# Patient Record
Sex: Female | Born: 1970
Health system: Southern US, Community
[De-identification: ages and names within clinical notes are randomized; demographics above are authoritative.]

## PROBLEM LIST (undated history)

## (undated) DIAGNOSIS — Z8742 Personal history of other diseases of the female genital tract: Secondary | ICD-10-CM

## (undated) DIAGNOSIS — F419 Anxiety disorder, unspecified: Secondary | ICD-10-CM

## (undated) DIAGNOSIS — N289 Disorder of kidney and ureter, unspecified: Secondary | ICD-10-CM

## (undated) HISTORY — DX: Personal history of other diseases of the female genital tract: Z87.42

## (undated) HISTORY — PX: OTHER SURGICAL HISTORY: SHX169

---
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
## 2010-08-26 ENCOUNTER — Inpatient Hospital Stay (HOSPITAL_COMMUNITY)
Admission: AD | Admit: 2010-08-26 | Discharge: 2010-08-26 | Payer: Self-pay | Source: Home / Self Care | Admitting: Obstetrics and Gynecology

## 2010-11-21 ENCOUNTER — Inpatient Hospital Stay (HOSPITAL_COMMUNITY)
Admission: AD | Admit: 2010-11-21 | Discharge: 2010-11-21 | Payer: Self-pay | Source: Home / Self Care | Attending: Obstetrics and Gynecology | Admitting: Obstetrics and Gynecology

## 2010-11-24 ENCOUNTER — Inpatient Hospital Stay (HOSPITAL_COMMUNITY)
Admission: RE | Admit: 2010-11-24 | Discharge: 2010-11-25 | Payer: Self-pay | Source: Home / Self Care | Attending: Obstetrics and Gynecology | Admitting: Obstetrics and Gynecology

## 2010-11-25 LAB — CBC
HCT: 29.2 % — ABNORMAL LOW (ref 36.0–46.0)
Hemoglobin: 10 g/dL — ABNORMAL LOW (ref 12.0–15.0)
MCH: 28.8 pg (ref 26.0–34.0)
MCHC: 34.2 g/dL (ref 30.0–36.0)
MCV: 84.1 fL (ref 78.0–100.0)

## 2011-01-13 LAB — RH IMMUNE GLOBULIN WORKUP (NOT WOMEN'S HOSP): Unit division: 0

## 2011-01-13 LAB — URINALYSIS, ROUTINE W REFLEX MICROSCOPIC
Glucose, UA: NEGATIVE mg/dL
Ketones, ur: NEGATIVE mg/dL
Leukocytes, UA: NEGATIVE
pH: 7 (ref 5.0–8.0)

## 2011-01-13 LAB — URINE MICROSCOPIC-ADD ON

## 2011-01-13 LAB — CBC
HCT: 33.6 % — ABNORMAL LOW (ref 36.0–46.0)
Hemoglobin: 11.8 g/dL — ABNORMAL LOW (ref 12.0–15.0)
MCH: 31.1 pg (ref 26.0–34.0)
MCV: 88.8 fL (ref 78.0–100.0)
Platelets: 241 10*3/uL (ref 150–400)

## 2011-01-13 LAB — FETAL SCREEN: Fetal Screen: NEGATIVE

## 2012-01-19 ENCOUNTER — Emergency Department (HOSPITAL_COMMUNITY)
Admission: EM | Admit: 2012-01-19 | Discharge: 2012-01-20 | Disposition: A | Discharge status: Home / Self Care | Payer: 59 | Attending: Emergency Medicine | Admitting: Emergency Medicine

## 2012-01-19 ENCOUNTER — Encounter (HOSPITAL_COMMUNITY): Payer: Self-pay

## 2012-01-19 DIAGNOSIS — T8140XA Infection following a procedure, unspecified, initial encounter: Secondary | ICD-10-CM | POA: Insufficient documentation

## 2012-01-19 DIAGNOSIS — T8149XA Infection following a procedure, other surgical site, initial encounter: Secondary | ICD-10-CM

## 2012-01-19 DIAGNOSIS — Y849 Medical procedure, unspecified as the cause of abnormal reaction of the patient, or of later complication, without mention of misadventure at the time of the procedure: Secondary | ICD-10-CM | POA: Insufficient documentation

## 2012-01-19 HISTORY — DX: Disorder of kidney and ureter, unspecified: N28.9

## 2012-01-19 MED ORDER — SODIUM CHLORIDE 0.9 % IV SOLN
3.0000 g | Freq: Once | INTRAVENOUS | Status: AC
  Administered 2012-01-20: 3 g via INTRAVENOUS
  Filled 2012-01-19: qty 3

## 2012-01-19 MED ORDER — HYDROMORPHONE HCL PF 1 MG/ML IJ SOLN
1.0000 mg | Freq: Once | INTRAMUSCULAR | Status: AC
  Administered 2012-01-20: 1 mg via INTRAVENOUS
  Filled 2012-01-19: qty 1

## 2012-01-19 MED ORDER — ONDANSETRON HCL 4 MG/2ML IJ SOLN
4.0000 mg | Freq: Once | INTRAMUSCULAR | Status: AC
  Administered 2012-01-20: 4 mg via INTRAVENOUS
  Filled 2012-01-19: qty 2
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
## 2012-01-19 NOTE — ED Notes (Signed)
Pt had left side root canal today. Swelling present to left side of face and under left eye. Pt unable to eat. Pt attempted to take pain medication but was unable to keep medication down. Dentist called pt to check on pt and informed swelling should not be as severe and to come to ER

## 2012-01-19 NOTE — ED Notes (Signed)
Patient states unable to keep down pain medication.

## 2012-01-20 MED ORDER — ONDANSETRON 8 MG PO TBDP: 8.0000 mg | ORAL_TABLET | Freq: Three times a day (TID) | ORAL | Status: AC | PRN

## 2012-01-20 MED ORDER — ONDANSETRON HCL 4 MG/2ML IJ SOLN
4.0000 mg | Freq: Once | INTRAMUSCULAR | Status: AC
  Administered 2012-01-20: 4 mg via INTRAVENOUS
  Filled 2012-01-20: qty 2

## 2012-05-20 ENCOUNTER — Emergency Department (HOSPITAL_COMMUNITY)
Admission: EM | Admit: 2012-05-20 | Discharge: 2012-05-20 | Disposition: A | Discharge status: Home / Self Care | Payer: PRIVATE HEALTH INSURANCE | Attending: Emergency Medicine | Admitting: Emergency Medicine

## 2012-05-20 ENCOUNTER — Encounter (HOSPITAL_COMMUNITY): Payer: Self-pay | Admitting: *Deleted

## 2012-05-20 DIAGNOSIS — H538 Other visual disturbances: Secondary | ICD-10-CM | POA: Insufficient documentation

## 2012-05-20 DIAGNOSIS — IMO0002 Reserved for concepts with insufficient information to code with codable children: Secondary | ICD-10-CM | POA: Insufficient documentation

## 2012-05-20 DIAGNOSIS — H5710 Ocular pain, unspecified eye: Secondary | ICD-10-CM | POA: Insufficient documentation

## 2012-05-20 DIAGNOSIS — Y9210 Unspecified residential institution as the place of occurrence of the external cause: Secondary | ICD-10-CM | POA: Insufficient documentation

## 2012-05-20 DIAGNOSIS — S0500XA Injury of conjunctiva and corneal abrasion without foreign body, unspecified eye, initial encounter: Secondary | ICD-10-CM

## 2012-05-20 DIAGNOSIS — S058X9A Other injuries of unspecified eye and orbit, initial encounter: Secondary | ICD-10-CM | POA: Insufficient documentation

## 2012-05-20 MED ORDER — TOBRAMYCIN 0.3 % OP OINT
TOPICAL_OINTMENT | Freq: Four times a day (QID) | OPHTHALMIC | Status: DC
  Administered 2012-05-20: 23:00:00 via OPHTHALMIC
  Filled 2012-05-20: qty 3.5

## 2012-05-20 MED ORDER — FLUORESCEIN SODIUM 1 MG OP STRP
ORAL_STRIP | OPHTHALMIC | Status: AC
  Administered 2012-05-20: 1 via OPHTHALMIC
  Filled 2012-05-20: qty 1

## 2012-05-20 MED ORDER — PROPARACAINE HCL 0.5 % OP SOLN
1.0000 [drp] | Freq: Once | OPHTHALMIC | Status: AC
  Administered 2012-05-20: 1 [drp] via OPHTHALMIC
  Filled 2012-05-20: qty 15
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
## 2012-11-26 ENCOUNTER — Encounter (HOSPITAL_COMMUNITY): Payer: Self-pay | Admitting: Emergency Medicine

## 2012-11-26 ENCOUNTER — Emergency Department (HOSPITAL_COMMUNITY)
Admission: EM | Admit: 2012-11-26 | Discharge: 2012-11-26 | Disposition: A | Discharge status: Home / Self Care | Payer: 59 | Attending: Emergency Medicine | Admitting: Emergency Medicine

## 2012-11-26 ENCOUNTER — Emergency Department (HOSPITAL_COMMUNITY): Payer: 59

## 2012-11-26 DIAGNOSIS — Z8679 Personal history of other diseases of the circulatory system: Secondary | ICD-10-CM | POA: Insufficient documentation

## 2012-11-26 DIAGNOSIS — M545 Low back pain, unspecified: Secondary | ICD-10-CM | POA: Insufficient documentation

## 2012-11-26 DIAGNOSIS — Z3202 Encounter for pregnancy test, result negative: Secondary | ICD-10-CM | POA: Insufficient documentation

## 2012-11-26 DIAGNOSIS — Z79899 Other long term (current) drug therapy: Secondary | ICD-10-CM | POA: Insufficient documentation

## 2012-11-26 DIAGNOSIS — IMO0002 Reserved for concepts with insufficient information to code with codable children: Secondary | ICD-10-CM | POA: Insufficient documentation

## 2012-11-26 DIAGNOSIS — Z87448 Personal history of other diseases of urinary system: Secondary | ICD-10-CM | POA: Insufficient documentation

## 2012-11-26 LAB — URINALYSIS, ROUTINE W REFLEX MICROSCOPIC
Bilirubin Urine: NEGATIVE
Glucose, UA: NEGATIVE mg/dL
Hgb urine dipstick: NEGATIVE
Ketones, ur: NEGATIVE mg/dL
Protein, ur: 30 mg/dL — AB
Urobilinogen, UA: 0.2 mg/dL (ref 0.0–1.0)

## 2012-11-26 LAB — URINE MICROSCOPIC-ADD ON

## 2012-11-26 MED ORDER — KETOROLAC TROMETHAMINE 30 MG/ML IJ SOLN
30.0000 mg | Freq: Once | INTRAMUSCULAR | Status: AC
  Administered 2012-11-26: 30 mg via INTRAVENOUS
  Filled 2012-11-26: qty 1

## 2012-11-26 MED ORDER — HYDROMORPHONE HCL PF 1 MG/ML IJ SOLN
1.0000 mg | Freq: Once | INTRAMUSCULAR | Status: DC
  Filled 2012-11-26: qty 1

## 2012-11-26 MED ORDER — OXYCODONE-ACETAMINOPHEN 5-325 MG PO TABS: 2.0000 | ORAL_TABLET | ORAL | Status: DC | PRN

## 2012-11-26 MED ORDER — PREDNISONE 20 MG PO TABS: 60.0000 mg | ORAL_TABLET | Freq: Every day | ORAL | Status: DC

## 2012-11-26 MED ORDER — PREDNISONE 20 MG PO TABS
60.0000 mg | ORAL_TABLET | Freq: Once | ORAL | Status: AC
  Administered 2012-11-26: 60 mg via ORAL
  Filled 2012-11-26: qty 3

## 2012-11-26 MED ORDER — DIAZEPAM 5 MG PO TABS
5.0000 mg | ORAL_TABLET | Freq: Once | ORAL | Status: AC
  Administered 2012-11-26: 5 mg via ORAL
  Filled 2012-11-26: qty 1

## 2012-11-26 MED ORDER — ONDANSETRON HCL 4 MG/2ML IJ SOLN
4.0000 mg | Freq: Once | INTRAMUSCULAR | Status: AC
  Administered 2012-11-26: 4 mg via INTRAVENOUS
  Filled 2012-11-26: qty 2

## 2012-11-26 MED ORDER — DIAZEPAM 5 MG PO TABS: 5.0000 mg | ORAL_TABLET | Freq: Two times a day (BID) | ORAL | Status: DC

## 2012-11-26 MED ORDER — HYDROMORPHONE HCL PF 1 MG/ML IJ SOLN
1.0000 mg | Freq: Once | INTRAMUSCULAR | Status: AC
  Administered 2012-11-26: 1 mg via INTRAVENOUS
  Filled 2012-11-26: qty 1

## 2012-11-26 NOTE — ED Notes (Signed)
Pt states that she began having heaviness in her legs after work on Monday morning.  The pain has gotten progressively worse over the week. Last night pt states she began having sacral pain in her back that is unrelieved by any medications or other interventions.

## 2012-11-26 NOTE — ED Notes (Signed)
MD at bedside. 

## 2012-11-26 NOTE — ED Notes (Signed)
Patient states that she has had lower back pain since Monday. States pain has gotten progressively worse. States that she has taken Valium, Baclofen, Percocet, Lortab, Tylenol, Motrin, Aspirin and has applied a Tens unit. All without the relief of pain. Patient states that she has had an episode of incontinence. Patient describes pain as a deep, dull ache from her waist down. Denies injury.
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
## 2012-11-27 LAB — URINE CULTURE: Colony Count: 7000

## 2013-10-11 LAB — HM COLONOSCOPY

## 2013-12-03 ENCOUNTER — Emergency Department (HOSPITAL_COMMUNITY)
Admission: EM | Admit: 2013-12-03 | Discharge: 2013-12-03 | Disposition: A | Discharge status: Home / Self Care | Payer: 59 | Attending: Emergency Medicine | Admitting: Emergency Medicine

## 2013-12-03 ENCOUNTER — Emergency Department (HOSPITAL_COMMUNITY): Payer: 59

## 2013-12-03 ENCOUNTER — Encounter (HOSPITAL_COMMUNITY): Payer: Self-pay | Admitting: Emergency Medicine

## 2013-12-03 DIAGNOSIS — S0990XA Unspecified injury of head, initial encounter: Secondary | ICD-10-CM | POA: Insufficient documentation

## 2013-12-03 DIAGNOSIS — Z87448 Personal history of other diseases of urinary system: Secondary | ICD-10-CM | POA: Insufficient documentation

## 2013-12-03 DIAGNOSIS — S139XXA Sprain of joints and ligaments of unspecified parts of neck, initial encounter: Secondary | ICD-10-CM | POA: Insufficient documentation

## 2013-12-03 DIAGNOSIS — S161XXA Strain of muscle, fascia and tendon at neck level, initial encounter: Secondary | ICD-10-CM

## 2013-12-03 DIAGNOSIS — Y92410 Unspecified street and highway as the place of occurrence of the external cause: Secondary | ICD-10-CM | POA: Insufficient documentation

## 2013-12-03 DIAGNOSIS — IMO0002 Reserved for concepts with insufficient information to code with codable children: Secondary | ICD-10-CM | POA: Insufficient documentation

## 2013-12-03 DIAGNOSIS — G43909 Migraine, unspecified, not intractable, without status migrainosus: Secondary | ICD-10-CM | POA: Insufficient documentation

## 2013-12-03 DIAGNOSIS — Z79899 Other long term (current) drug therapy: Secondary | ICD-10-CM | POA: Insufficient documentation

## 2013-12-03 DIAGNOSIS — Y9389 Activity, other specified: Secondary | ICD-10-CM | POA: Insufficient documentation

## 2013-12-03 MED ORDER — CYCLOBENZAPRINE HCL 10 MG PO TABS: 10.0000 mg | ORAL_TABLET | Freq: Three times a day (TID) | ORAL | Status: DC | PRN

## 2013-12-03 MED ORDER — OXYCODONE-ACETAMINOPHEN 5-325 MG PO TABS: 1.0000 | ORAL_TABLET | Freq: Four times a day (QID) | ORAL | Status: DC | PRN

## 2013-12-03 MED ORDER — OXYCODONE-ACETAMINOPHEN 5-325 MG PO TABS
1.0000 | ORAL_TABLET | Freq: Once | ORAL | Status: AC
  Administered 2013-12-03: 1 via ORAL
  Filled 2013-12-03: qty 1
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
## 2013-12-03 NOTE — Discharge Instructions (Signed)
Cervical Sprain A cervical sprain is an injury in the neck in which the strong, fibrous tissues (ligaments) that connect your neck bones stretch or tear. Cervical sprains can range from mild to severe. Severe cervical sprains can cause the neck vertebrae to be unstable. This can lead to damage of the spinal cord and can result in serious nervous system problems. The amount of time it takes for a cervical sprain to get better depends on the cause and extent of the injury. Most cervical sprains heal in 1 to 3 weeks. CAUSES  Severe cervical sprains may be caused by:   Contact sport injuries (such as from football, rugby, wrestling, hockey, auto racing, gymnastics, diving, martial arts, or boxing).   Motor vehicle collisions.   Whiplash injuries. This is an injury from a sudden forward-and backward whipping movement of the head and neck.  Falls.  Mild cervical sprains may be caused by:   Being in an awkward position, such as while cradling a telephone between your ear and shoulder.   Sitting in a chair that does not offer proper support.   Working at a poorly designed computer station.   Looking up or down for long periods of time.  SYMPTOMS   Pain, soreness, stiffness, or a burning sensation in the front, back, or sides of the neck. This discomfort may develop immediately after the injury or slowly, 24 hours or more after the injury.   Pain or tenderness directly in the middle of the back of the neck.   Shoulder or upper back pain.   Limited ability to move the neck.   Headache.   Dizziness.   Weakness, numbness, or tingling in the hands or arms.   Muscle spasms.   Difficulty swallowing or chewing.   Tenderness and swelling of the neck.  DIAGNOSIS  Most of the time your health care provider can diagnose a cervical sprain by taking your history and doing a physical exam. Your health care provider will ask about previous neck injuries and any known neck  problems, such as arthritis in the neck. X-rays may be taken to find out if there are any other problems, such as with the bones of the neck. Other tests, such as a CT scan or MRI, may also be needed.  TREATMENT  Treatment depends on the severity of the cervical sprain. Mild sprains can be treated with rest, keeping the neck in place (immobilization), and pain medicines. Severe cervical sprains are immediately immobilized. Further treatment is done to help with pain, muscle spasms, and other symptoms and may include:  Medicines, such as pain relievers, numbing medicines, or muscle relaxants.   Physical therapy. This may involve stretching exercises, strengthening exercises, and posture training. Exercises and improved posture can help stabilize the neck, strengthen muscles, and help stop symptoms from returning.  HOME CARE INSTRUCTIONS   Put ice on the injured area.   Put ice in a plastic bag.   Place a towel between your skin and the bag.   Leave the ice on for 15 20 minutes, 3 4 times a day.   If your injury was severe, you may have been given a cervical collar to wear. A cervical collar is a two-piece collar designed to keep your neck from moving while it heals.  Do not remove the collar unless instructed by your health care provider.  If you have long hair, keep it outside of the collar.  Ask your health care provider before making any adjustments to your collar.   Minor adjustments may be required over time to improve comfort and reduce pressure on your chin or on the back of your head.  Ifyou are allowed to remove the collar for cleaning or bathing, follow your health care provider's instructions on how to do so safely.  Keep your collar clean by wiping it with mild soap and water and drying it completely. If the collar you have been given includes removable pads, remove them every 1 2 days and hand wash them with soap and water. Allow them to air dry. They should be completely  dry before you wear them in the collar.  If you are allowed to remove the collar for cleaning and bathing, wash and dry the skin of your neck. Check your skin for irritation or sores. If you see any, tell your health care provider.  Do not drive while wearing the collar.   Only take over-the-counter or prescription medicines for pain, discomfort, or fever as directed by your health care provider.   Keep all follow-up appointments as directed by your health care provider.   Keep all physical therapy appointments as directed by your health care provider.   Make any needed adjustments to your workstation to promote good posture.   Avoid positions and activities that make your symptoms worse.   Warm up and stretch before being active to help prevent problems.  SEEK MEDICAL CARE IF:   Your pain is not controlled with medicine.   You are unable to decrease your pain medicine over time as planned.   Your activity level is not improving as expected.  SEEK IMMEDIATE MEDICAL CARE IF:   You develop any bleeding.  You develop stomach upset.  You have signs of an allergic reaction to your medicine.   Your symptoms get worse.   You develop new, unexplained symptoms.   You have numbness, tingling, weakness, or paralysis in any part of your body.  MAKE SURE YOU:   Understand these instructions.  Will watch your condition.  Will get help right away if you are not doing well or get worse. Document Released: 08/15/2007 Document Revised: 08/08/2013 Document Reviewed: 04/25/2013 [employer] Patient Information 2014 [employer].  
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
## 2013-12-03 NOTE — ED Notes (Signed)
Bed: [id number] Expected date:  Expected time:  Means of arrival:  Comments: EMS neck and head

## 2014-08-18 ENCOUNTER — Emergency Department (HOSPITAL_COMMUNITY)
Admission: EM | Admit: 2014-08-18 | Discharge: 2014-08-18 | Disposition: A | Discharge status: Home / Self Care | Payer: 59 | Attending: Emergency Medicine | Admitting: Emergency Medicine

## 2014-08-18 ENCOUNTER — Encounter (HOSPITAL_COMMUNITY): Payer: Self-pay | Admitting: Emergency Medicine

## 2014-08-18 DIAGNOSIS — G43909 Migraine, unspecified, not intractable, without status migrainosus: Secondary | ICD-10-CM | POA: Diagnosis not present

## 2014-08-18 DIAGNOSIS — Z7952 Long term (current) use of systemic steroids: Secondary | ICD-10-CM | POA: Insufficient documentation

## 2014-08-18 DIAGNOSIS — F419 Anxiety disorder, unspecified: Secondary | ICD-10-CM | POA: Insufficient documentation

## 2014-08-18 DIAGNOSIS — Z87448 Personal history of other diseases of urinary system: Secondary | ICD-10-CM | POA: Diagnosis not present

## 2014-08-18 DIAGNOSIS — M545 Low back pain, unspecified: Secondary | ICD-10-CM

## 2014-08-18 DIAGNOSIS — Z79899 Other long term (current) drug therapy: Secondary | ICD-10-CM | POA: Diagnosis not present

## 2014-08-18 DIAGNOSIS — Z791 Long term (current) use of non-steroidal anti-inflammatories (NSAID): Secondary | ICD-10-CM | POA: Insufficient documentation

## 2014-08-18 HISTORY — DX: Anxiety disorder, unspecified: F41.9

## 2014-08-18 MED ORDER — ONDANSETRON HCL 4 MG/2ML IJ SOLN
4.0000 mg | Freq: Once | INTRAMUSCULAR | Status: AC
  Administered 2014-08-18: 4 mg via INTRAVENOUS
  Filled 2014-08-18: qty 2

## 2014-08-18 MED ORDER — KETOROLAC TROMETHAMINE 30 MG/ML IJ SOLN
30.0000 mg | Freq: Once | INTRAMUSCULAR | Status: AC
  Administered 2014-08-18: 30 mg via INTRAVENOUS
  Filled 2014-08-18: qty 1

## 2014-08-18 MED ORDER — SODIUM CHLORIDE 0.9 % IV BOLUS (SEPSIS)
1000.0000 mL | Freq: Once | INTRAVENOUS | Status: AC
  Administered 2014-08-18: 1000 mL via INTRAVENOUS

## 2014-08-18 MED ORDER — DIAZEPAM 5 MG/ML IJ SOLN
5.0000 mg | Freq: Once | INTRAMUSCULAR | Status: AC
  Administered 2014-08-18: 5 mg via INTRAVENOUS
  Filled 2014-08-18: qty 2

## 2014-08-18 MED ORDER — KETOROLAC TROMETHAMINE 10 MG PO TABS: 10.0000 mg | ORAL_TABLET | Freq: Four times a day (QID) | ORAL | Status: DC | PRN

## 2014-08-18 MED ORDER — DIAZEPAM 10 MG PO TABS: 10.0000 mg | ORAL_TABLET | Freq: Four times a day (QID) | ORAL | Status: DC | PRN

## 2014-08-18 MED ORDER — METHYLPREDNISOLONE SODIUM SUCC 125 MG IJ SOLR
125.0000 mg | Freq: Once | INTRAMUSCULAR | Status: AC
  Administered 2014-08-18: 125 mg via INTRAVENOUS
  Filled 2014-08-18: qty 2

## 2014-08-18 MED ORDER — PREDNISONE 50 MG PO TABS: ORAL_TABLET | ORAL | Status: DC

## 2014-08-18 NOTE — ED Notes (Signed)
PT c/o back spasms worsening x1 day. PT c/o lower back and into left iliac crest. PT denies any leg pain or tingling.

## 2014-08-18 NOTE — Discharge Instructions (Signed)
Prescriptions for prednisone, Toradol tablets, Valium 10 mg.   Rest.

## 2014-08-18 NOTE — ED Notes (Signed)
Patient with no complaints at this time. Respirations even and unlabored. Skin warm/dry. Discharge instructions reviewed with patient at this time. Patient given opportunity to voice concerns/ask questions. IV removed per policy and band-aid applied to site. Patient discharged at this time and left Emergency Department with steady gait.  
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
## 2014-12-17 ENCOUNTER — Emergency Department (HOSPITAL_COMMUNITY): Payer: 59

## 2014-12-17 ENCOUNTER — Emergency Department (HOSPITAL_COMMUNITY)
Admission: EM | Admit: 2014-12-17 | Discharge: 2014-12-17 | Disposition: A | Discharge status: Home / Self Care | Payer: 59 | Attending: Emergency Medicine | Admitting: Emergency Medicine

## 2014-12-17 ENCOUNTER — Encounter (HOSPITAL_COMMUNITY): Payer: Self-pay | Admitting: Emergency Medicine

## 2014-12-17 DIAGNOSIS — Z87828 Personal history of other (healed) physical injury and trauma: Secondary | ICD-10-CM | POA: Diagnosis not present

## 2014-12-17 DIAGNOSIS — R112 Nausea with vomiting, unspecified: Secondary | ICD-10-CM | POA: Insufficient documentation

## 2014-12-17 DIAGNOSIS — R109 Unspecified abdominal pain: Secondary | ICD-10-CM | POA: Diagnosis not present

## 2014-12-17 DIAGNOSIS — Z87448 Personal history of other diseases of urinary system: Secondary | ICD-10-CM | POA: Diagnosis not present

## 2014-12-17 DIAGNOSIS — G43909 Migraine, unspecified, not intractable, without status migrainosus: Secondary | ICD-10-CM | POA: Diagnosis not present

## 2014-12-17 DIAGNOSIS — F419 Anxiety disorder, unspecified: Secondary | ICD-10-CM | POA: Diagnosis not present

## 2014-12-17 DIAGNOSIS — R1012 Left upper quadrant pain: Secondary | ICD-10-CM | POA: Diagnosis present

## 2014-12-17 DIAGNOSIS — Z79899 Other long term (current) drug therapy: Secondary | ICD-10-CM | POA: Insufficient documentation

## 2014-12-17 DIAGNOSIS — M549 Dorsalgia, unspecified: Secondary | ICD-10-CM

## 2014-12-17 DIAGNOSIS — Z3202 Encounter for pregnancy test, result negative: Secondary | ICD-10-CM | POA: Insufficient documentation

## 2014-12-17 LAB — CBC WITH DIFFERENTIAL/PLATELET
BASOS ABS: 0 10*3/uL (ref 0.0–0.1)
Basophils Relative: 0 % (ref 0–1)
EOS PCT: 4 % (ref 0–5)
Eosinophils Absolute: 0.3 10*3/uL (ref 0.0–0.7)
HEMATOCRIT: 40.4 % (ref 36.0–46.0)
HEMOGLOBIN: 13.4 g/dL (ref 12.0–15.0)
LYMPHS PCT: 12 % (ref 12–46)
Lymphs Abs: 0.9 10*3/uL (ref 0.7–4.0)
MCH: 29.8 pg (ref 26.0–34.0)
MCHC: 33.2 g/dL (ref 30.0–36.0)
MCV: 90 fL (ref 78.0–100.0)
MONO ABS: 0.4 10*3/uL (ref 0.1–1.0)
MONOS PCT: 5 % (ref 3–12)
NEUTROS ABS: 6.5 10*3/uL (ref 1.7–7.7)
Neutrophils Relative %: 79 % — ABNORMAL HIGH (ref 43–77)
Platelets: 226 10*3/uL (ref 150–400)
RBC: 4.49 MIL/uL (ref 3.87–5.11)
RDW: 13.2 % (ref 11.5–15.5)
WBC: 8.2 10*3/uL (ref 4.0–10.5)

## 2014-12-17 LAB — URINALYSIS, ROUTINE W REFLEX MICROSCOPIC
BILIRUBIN URINE: NEGATIVE
Glucose, UA: NEGATIVE mg/dL
HGB URINE DIPSTICK: NEGATIVE
KETONES UR: NEGATIVE mg/dL
Leukocytes, UA: NEGATIVE
Nitrite: NEGATIVE
PROTEIN: NEGATIVE mg/dL
SPECIFIC GRAVITY, URINE: 1.025 (ref 1.005–1.030)
UROBILINOGEN UA: 0.2 mg/dL (ref 0.0–1.0)
pH: 5.5 (ref 5.0–8.0)

## 2014-12-17 LAB — COMPREHENSIVE METABOLIC PANEL
ALK PHOS: 60 U/L (ref 39–117)
ALT: 25 U/L (ref 0–35)
AST: 21 U/L (ref 0–37)
Albumin: 4.3 g/dL (ref 3.5–5.2)
Anion gap: 8 (ref 5–15)
BUN: 15 mg/dL (ref 6–23)
CO2: 26 mmol/L (ref 19–32)
Calcium: 9.1 mg/dL (ref 8.4–10.5)
Chloride: 105 mmol/L (ref 96–112)
Creatinine, Ser: 0.96 mg/dL (ref 0.50–1.10)
GFR, EST AFRICAN AMERICAN: 83 mL/min — AB (ref >90)
GFR, EST NON AFRICAN AMERICAN: 72 mL/min — AB (ref >90)
GLUCOSE: 96 mg/dL (ref 70–99)
POTASSIUM: 4.1 mmol/L (ref 3.5–5.1)
SODIUM: 139 mmol/L (ref 135–145)
Total Bilirubin: 0.8 mg/dL (ref 0.3–1.2)
Total Protein: 7.1 g/dL (ref 6.0–8.3)

## 2014-12-17 LAB — LIPASE, BLOOD: LIPASE: 41 U/L (ref 11–59)

## 2014-12-17 LAB — PREGNANCY, URINE: PREG TEST UR: NEGATIVE

## 2014-12-17 MED ORDER — ONDANSETRON 4 MG PO TBDP: 4.0000 mg | ORAL_TABLET | Freq: Three times a day (TID) | ORAL | Status: DC | PRN

## 2014-12-17 MED ORDER — GI COCKTAIL ~~LOC~~
30.0000 mL | Freq: Once | ORAL | Status: AC
  Administered 2014-12-17: 30 mL via ORAL
  Filled 2014-12-17: qty 30

## 2014-12-17 MED ORDER — ONDANSETRON 8 MG PO TBDP
8.0000 mg | ORAL_TABLET | Freq: Once | ORAL | Status: AC
  Administered 2014-12-17: 8 mg via ORAL
  Filled 2014-12-17: qty 1

## 2014-12-17 MED ORDER — DICYCLOMINE HCL 10 MG/ML IM SOLN
20.0000 mg | Freq: Once | INTRAMUSCULAR | Status: AC
  Administered 2014-12-17: 20 mg via INTRAMUSCULAR
  Filled 2014-12-17: qty 2

## 2014-12-17 MED ORDER — DICYCLOMINE HCL 20 MG PO TABS: 20.0000 mg | ORAL_TABLET | Freq: Four times a day (QID) | ORAL | Status: DC | PRN
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
## 2014-12-17 NOTE — Discharge Instructions (Signed)
°Emergency Department Resource Guide °1) Find a Doctor and Pay Out of Pocket °Although you won't have to find out who is covered by your insurance plan, it is a good idea to ask around and get recommendations. You will then need to call the office and see if the doctor you have chosen will accept you as a new patient and what types of options they offer for patients who are self-pay. Some doctors offer discounts or will set up payment plans for their patients who do not have insurance, but you will need to ask so you aren't surprised when you get to your appointment. ° °2) Contact Your Local Health Department °Not all health departments have doctors that can see patients for sick visits, but many do, so it is worth a call to see if yours does. If you don't know where your local health department is, you can check in your phone book. The CDC also has a tool to help you locate your state's health department, and many state websites also have listings of all of their local health departments. ° °3) Find a Walk-in Clinic °If your illness is not likely to be very severe or complicated, you may want to try a walk in clinic. These are popping up all over the country in pharmacies, drugstores, and shopping centers. They're usually staffed by nurse practitioners or physician assistants that have been trained to treat common illnesses and complaints. They're usually fairly quick and inexpensive. However, if you have serious medical issues or chronic medical problems, these are probably not your best option. ° °No Primary Care Doctor: °- Call [facility] at  832-8000 - they can help you locate a primary care doctor that  accepts your insurance, provides certain services, etc. °- Physician Referral Service- 1-800-533-3463 ° °Chronic Pain Problems: °Organization         Address  Phone   Notes  °[facility]  ([phone]) [phone] Patients need to be referred by their primary care doctor.  ° °Medication  Assistance: °Organization         Address  Phone   Notes  °[place] [facility] [address] °[place], [place] [address] (336) 641-8030 --Must be a resident of [place] °-- Must have NO insurance coverage whatsoever (no Medicaid/ Medicare, etc.) °-- The pt. MUST have a primary care doctor that directs their care regularly and follows them in the community °  °[facility]  (866) 331-1348   °[employer]  (888) 892-1162   ° °Agencies that provide inexpensive medical care: °Organization         Address  Phone   Notes  °[facility]  (336) 832-8035   °[facility]    (336) 832-7272   °[facility] [address] °[place], [place] [address] (336) 832-4777   °[facility] [place] [facility] [address], °[place] (336) 271-4999   °[facility]    (336) 373-0678   °[facility]    (336) 272-1050   °[facility] ° [address], [place] Phone:  (336) 832-4444, Fax:  (336) 832-4440 Hours of Operation:  9 am - 6 pm, M-F.  Also accepts Medicaid/Medicare and self-pay.  °[facility] ° [address], [place] Phone: (336) [phone], Fax: (336) [phone]. Hours of Operation:  8:30 am - 5:30 pm, M-F.  Also accepts Medicaid and self-pay.  °[facility] [place] [address], [place] Phone: (336) 878-6027   °[facility] [address], [place], [place] (336)723-1848, Ext. 123 Mondays & Thursdays: 7-9 AM.  First 15 patients are seen on a first come, first serve basis. °  ° °Medicaid-accepting [place] Providers: ° °Organization         Address  Phone   Notes  °[facility] [address], [place] (336) [phone] Also accepts self-pay patients.  °[facility] [address], [place] ° (336) 856-9996   °[facility] [address], [place]  (336) 288-8857   °[facility] [address] [place] [address], [place] (336) 299-7000   °Veita Bland [address], [place]  ° (336) 373-1557 Only accepts [place] Access Medicaid patients after they have their name applied to their card.  ° °Self-Pay (no insurance) in [place]: ° °Organization         Address  Phone   Notes  °Sickle Cell Patients, [facility] [address], [place] (336) 832-1970   °[facility] [address], [place] (336) 832-4400   °[facility] ° [address], [place] (336) 992-4800   °[facility]/Dr. Osei-Bonsu ° [address] [place] [address], [place] or [address], [place] (336) [phone] Phone number for both [place] and [place] locations is the same.  °[facility] [address], [place] (336) 299-0000   °[facility] [address] [place] [address], [place] or [address] (336) 852-7530 °(336) 878-2260   °[facility] [address], [place] (336) 350-1642, phone; (336) 294-5005, fax Sees patients 1st and 3rd Saturday of every month.  Must not qualify for public or private insurance (i.e. Medicaid, Medicare, [place] Health Choice, Veterans' Benefits) • Household income should be no more than 200% of the poverty level •The clinic cannot treat you if you are pregnant or think you are pregnant • Sexually transmitted diseases are not treated at the clinic.  ° ° °Dental Care: °Organization         Address  Phone  Notes  °[place] [facility] [address], [place] (336) 641-6152 Accepts children up to age 21 who are enrolled in Medicaid or [place] Health Choice; pregnant women with a Medicaid card; and children who have applied for Medicaid or [place] Health Choice, but were declined, whose parents can pay a reduced fee at time of service.  °[place]  [facility] [place]  [address], [place] (336) 641-7733 Accepts children up to age 21 who are enrolled in Medicaid or [place] Health Choice; pregnant women with a Medicaid card; and children who have applied for Medicaid or [place] Health Choice, but were declined, whose parents can pay a reduced fee at time of service.  °[facility] ° [address], [place] (336) 641-4533 Patients are seen by appointment only. Walk-ins are not accepted. [facility] will see patients 18 years of age and older. °Monday - Tuesday (8am-5pm) °Most Wednesdays (8:30-5pm) °$30 per visit, cash only  °[facility] ° [address], [place] (336) 641-4533 Patients are seen by appointment only. Walk-ins are not accepted. [facility] will see patients 18 years of age and older. °One   Wednesday Evening (Monthly: Volunteer Based).  $30 per visit, cash only  °[facility]  (919) [phone] for adults; Children under age 4, call Graduate Pediatric Dentistry at ([phone]) [phone]. Children aged 4-14, please call ([phone]) [phone] to request a pediatric application. ° Dental services are provided in all areas of dental care including fillings, crowns and bridges, complete and partial dentures, implants, gum treatment, root canals, and extractions. Preventive care is also provided. Treatment is provided to both adults and children. °Patients are selected via a lottery and there is often a waiting list. °  °[facility] [address], °[place] ° (336) 763-8833 [email or web address] °  °[facility] [address], [place], [place] (336)723-1848, Ext. 123 Second and Fourth Thursday of each month, opens at 6:30 AM; Clinic ends at 9 AM.  Patients are seen on a first-come first-served basis, and a limited number are seen during each clinic.  ° °[facility] ° [address], [place], [place] (336) [phone]    Eligibility Requirements °You must have lived in [place], [place], or [place] counties for at least the last three months. °  You cannot be eligible for state or federal sponsored healthcare insurance, including Veterans Administration, Medicaid, or Medicare. °  You generally cannot be eligible for healthcare insurance through your employer.  °  How to apply: °Eligibility screenings are held every Tuesday and Wednesday afternoon from 1:00 pm until 4:00 pm. You do not need an appointment for the interview!  °[facility] [address], [place], [place] [phone]   °[facility]  336-342-8273   °[facility]  336-703-3100   °[facility]  336-570-6415   ° °Behavioral Health Resources in the Community: °Intensive Outpatient Programs °Organization         Address  Phone  Notes  °[facility] [address], [place], [place] 336-878-6098   °[facility] [address], [place], [place] 336-832-9800   °[facility] [address], [place], [place] ° 336-882-2125   °[facility] [address],  °[place], [place] 1-800-853-5163 or 336-641-4981   °Substance Abuse Resources °Organization         Address  Phone  Notes  °[facility]  336-882-2125   °[facility]  336-784-9470   °[facility]  336-285-9073   °[facility]  336-845-3988   °Residential & Outpatient Substance Abuse Program  1-800-659-3381   °Psychological Services °Organization         Address  Phone  Notes  °[facility]  336- 832-9600   °[facility]  336- 378-7881   °[facility] [address], [place] 1-800-853-5163 or 336-641-4981   ° °Mobile Crisis Teams °Organization         Address  Phone  Notes  °[facility], Mobile Crisis Care Unit  1-877-626-1772   °Assertive °[facility] ° [address]  [place], [place] 336-834-9664   °Sharon DeEsch [address] °[place] 336-554-5454   ° °Self-Help/Support Groups °Organization         Address  Phone             Notes  °[facility] of  - variety of support groups  336- 373-1402 Call for more information  °Narcotics Anonymous (NA), [facility] [address], °[place]  2 meetings at this location  ° °  Residential Treatment Programs Organization         Address  Phone  Notes  [facility] [address],    [place]  [phone]   [facility]  [address], [place] [phone], [place], [place] [phone]   [facility] [address], [place] [phone] Admissions: 8am-3pm M-F  [facility] [address]    [place], [place] [phone]   [facility] [address], [place], [place] [phone]   [facility] [address]  [place], [place] [phone]   [facility] Intensive Outpatient [address], [place], [place] [phone]   [facility] ([facility] [address]  [place], [place] [phone] or [phone]   [facility] [address] [place], [place] [phone] Accepts Medicaid  [facility] [address]  [place] [phone] Substance Abuse/Addiction Treatment   [facility] Organization         Address  Phone  Notes  [facility]  [phone](888) [phone]   Angie FavaJulie Brannon, PhD [address], [address] [place], [place]   ([phone]) [phone](336) [phone] or [phone](336) [phone]   [facility]   [address] [place], [place] [phone](336) ([phone]   [facility] [address], [place], [place] [phone](336) [phone] Insurance/Medicaid/sponsorship through [facility]  [facility] [address]                                    [place], [place] [phone]) [phone] Therapy/tele-psych/case    [facility] [address]   [place], [place] [phone](336) [phone]    Dr. Lolly MustacheArfeen  [phone]) [phone]   [facility] [place] [facility]  [employer] [facility] 1) [address], [place] 2) [address], [place] 3)  [address], [place] [phone](336) [phone](336) [phone]  [phone](336) [phone]   [facility] Child Abuse Hotline (351)385-2836(336) (828) 046-8897 or 201-336-1684(336) [phone] (After Hours)      Eat a bland diet, avoiding greasy, fatty, fried foods, as well as spicy and acidic foods or beverages.  Avoid eating within the hour or 2 before going to bed or laying down.  Also avoid teas, colas, coffee, chocolate, pepermint and spearment.  Take over the counter pepcid, one or two tablets by mouth twice a day, for the next 2 to 3 weeks.  May also take over the counter maalox/mylanta, as directed on packaging, as needed for discomfort.  Take the prescriptions as directed.  Call your regular medical doctor tomorrow to schedule a follow up appointment this week.  Return to the Emergency Department immediately if worsening.

## 2014-12-17 NOTE — ED Notes (Signed)
Pt states that she has been having luq pain since Friday intermittently and becoming constant this morning.  Also constant nausea with vomiting x1.

## 2015-11-19 DIAGNOSIS — Z01419 Encounter for gynecological examination (general) (routine) without abnormal findings: Secondary | ICD-10-CM | POA: Diagnosis not present

## 2015-11-19 DIAGNOSIS — Z6828 Body mass index (BMI) 28.0-28.9, adult: Secondary | ICD-10-CM | POA: Diagnosis not present

## 2015-11-25 MED FILL — BUPROPION HCL XL 300 MG TAB: 300 | 30 days supply | Qty: 30 | Fill #4

## 2015-11-25 MED FILL — ARMODAFINIL 150 MG TABLET: 150 | 30 days supply | Qty: 30 | Fill #0

## 2015-11-25 MED FILL — VIT D2 1.25 MG (50,000 UNIT: 1.25 MG | 28 days supply | Qty: 4 | Fill #4

## 2015-11-26 MED FILL — clonazePAM 0.5 MG TABS: 0.5 | 30 days supply | Qty: 60 | Fill #0

## 2015-12-18 ENCOUNTER — Telehealth: Payer: 59 | Admitting: Physician Assistant

## 2015-12-18 DIAGNOSIS — B9689 Other specified bacterial agents as the cause of diseases classified elsewhere: Secondary | ICD-10-CM

## 2015-12-18 DIAGNOSIS — J00 Acute nasopharyngitis [common cold]: Secondary | ICD-10-CM

## 2015-12-18 DIAGNOSIS — J208 Acute bronchitis due to other specified organisms: Principal | ICD-10-CM

## 2015-12-18 MED ORDER — LEVOFLOXACIN 500 MG PO TABS: 500.0000 mg | ORAL_TABLET | Freq: Every day | ORAL | Status: DC

## 2015-12-18 NOTE — Progress Notes (Signed)
We are sorry that you are not feeling well.  Here is how we plan to help!  Based on what you have shared with me it looks like you have upper respiratory tract inflammation that has resulted in a significant cough.  Inflammation and infection in the upper respiratory tract is commonly called bronchitis and has four common causes:  Allergies, Viral Infections, Acid Reflux and Bacterial Infections.  Allergies, viruses and acid reflux are treated by controlling symptoms or eliminating the cause. An example might be a cough caused by taking certain blood pressure medications. You stop the cough by changing the medication. Another example might be a cough caused by acid reflux. Controlling the reflux helps control the cough.  Based on your presentation I believe you most likely have A cough due to bacteria.  When patients have a fever and a productive cough with a change in color or increased sputum production, we are concerned about bacterial bronchitis.  If left untreated it can progress to pneumonia.  If your symptoms do not improve with your treatment plan it is important that you contact your provider.   I hve prescribed Levofloxacin 500 mg daily for 7 days   In addition you may use A non-prescription cough medication called Robitussin DAC. Take 2 teaspoons every 8 hours or Delsym: take 2 teaspoons every 12 hours.    HOME CARE . Only take medications as instructed by your medical team. . Complete the entire course of an antibiotic. . Drink plenty of fluids and get plenty of rest. . Avoid close contacts especially the very young and the elderly . Cover your mouth if you cough or cough into your sleeve. . Always remember to wash your hands . A steam or ultrasonic humidifier can help congestion.    GET HELP RIGHT AWAY IF: . You develop worsening fever. . You become short of breath . You cough up blood. . Your symptoms persist after you have completed your treatment plan MAKE SURE YOU    Understand these instructions.  Will watch your condition.  Will get help right away if you are not doing well or get worse.  Your e-visit answers were reviewed by a board certified advanced clinical practitioner to complete your personal care plan.  Depending on the condition, your plan could have included both over the counter or prescription medications. If there is a problem please reply  once you have received a response from your provider. Your safety is important to [place].  If you have drug allergies check your prescription carefully.    You can use MyChart to ask questions about today's visit, request a non-urgent call back, or ask for a work or school excuse for 24 hours related to this e-Visit. If it has been greater than 24 hours you will need to follow up with your provider, or enter a new e-Visit to address those concerns. You will get an e-mail in the next two days asking about your experience.  I hope that your e-visit has been valuable and will speed your recovery. Thank you for using e-visits.

## 2016-01-05 MED FILL — BUPROPION HCL XL 300 MG TAB: 300 | 30 days supply | Qty: 30 | Fill #5

## 2016-01-06 MED FILL — clonazePAM 0.5 MG TABS: 0.5 | 30 days supply | Qty: 60 | Fill #1

## 2016-01-06 MED FILL — ARMODAFINIL 150 MG TABLET: 150 | 30 days supply | Qty: 30 | Fill #1

## 2016-01-06 MED FILL — VIT D2 1.25 MG (50,000 UNIT: 1.25 MG | 28 days supply | Qty: 4 | Fill #5

## 2016-01-28 DIAGNOSIS — T148 Other injury of unspecified body region: Secondary | ICD-10-CM | POA: Diagnosis not present

## 2016-01-28 DIAGNOSIS — S6990XA Unspecified injury of unspecified wrist, hand and finger(s), initial encounter: Secondary | ICD-10-CM | POA: Diagnosis not present

## 2016-01-28 DIAGNOSIS — M79645 Pain in left finger(s): Secondary | ICD-10-CM | POA: Diagnosis not present

## 2016-02-19 MED FILL — BUPROPION HCL XL 300 MG TAB: 300 | 30 days supply | Qty: 30 | Fill #6

## 2016-02-19 MED FILL — VIT D2 1.25 MG (50,000 UNIT: 1.25 MG | 28 days supply | Qty: 4 | Fill #6

## 2016-02-19 MED FILL — clonazePAM 0.5 MG TABS: 0.5 | 30 days supply | Qty: 60 | Fill #2

## 2016-04-05 DIAGNOSIS — N390 Urinary tract infection, site not specified: Secondary | ICD-10-CM | POA: Diagnosis not present

## 2016-04-05 DIAGNOSIS — F419 Anxiety disorder, unspecified: Secondary | ICD-10-CM | POA: Diagnosis not present

## 2016-04-05 DIAGNOSIS — Z0000 Encounter for general adult medical examination without abnormal findings: Secondary | ICD-10-CM | POA: Diagnosis not present

## 2016-04-05 DIAGNOSIS — E559 Vitamin D deficiency, unspecified: Secondary | ICD-10-CM | POA: Diagnosis not present

## 2016-04-08 DIAGNOSIS — F419 Anxiety disorder, unspecified: Secondary | ICD-10-CM | POA: Diagnosis not present

## 2016-04-08 DIAGNOSIS — Z0000 Encounter for general adult medical examination without abnormal findings: Secondary | ICD-10-CM | POA: Diagnosis not present

## 2016-04-08 DIAGNOSIS — E559 Vitamin D deficiency, unspecified: Secondary | ICD-10-CM | POA: Diagnosis not present

## 2016-04-08 MED FILL — VIT D2 1.25 MG (50,000 UNIT: 1.25 MG | 28 days supply | Qty: 4 | Fill #0

## 2016-04-08 MED FILL — BUPROPION HCL XL 300 MG TAB: 300 | 30 days supply | Qty: 30 | Fill #0

## 2016-04-12 MED FILL — LORazepam 0.5 MG TABS: 0.5 | 30 days supply | Qty: 60 | Fill #0

## 2016-04-14 MED FILL — ARMODAFINIL 150 MG TABLET: 150 | 30 days supply | Qty: 30 | Fill #2

## 2016-05-13 MED FILL — LORazepam 0.5 MG TABS: 0.5 | 30 days supply | Qty: 60 | Fill #1

## 2016-05-14 MED FILL — BUPROPION HCL XL 300 MG TAB: 300 | 30 days supply | Qty: 30 | Fill #1

## 2016-05-14 MED FILL — ARMODAFINIL 150 MG TABLET: 150 | 30 days supply | Qty: 30 | Fill #3

## 2016-05-14 MED FILL — VIT D2 1.25 MG (50,000 UNIT: 1.25 MG | 28 days supply | Qty: 4 | Fill #1

## 2016-06-14 ENCOUNTER — Ambulatory Visit (INDEPENDENT_AMBULATORY_CARE_PROVIDER_SITE_OTHER): Payer: 59 | Admitting: Family Medicine

## 2016-06-14 ENCOUNTER — Encounter: Payer: Self-pay | Admitting: Family Medicine

## 2016-06-14 VITALS — BP 129/84 | HR 67 | Ht 69.0 in | Wt 170.0 lb

## 2016-06-14 DIAGNOSIS — M5442 Lumbago with sciatica, left side: Secondary | ICD-10-CM

## 2016-06-14 DIAGNOSIS — M545 Low back pain: Secondary | ICD-10-CM

## 2016-06-14 NOTE — Patient Instructions (Signed)
Your exam and history (along with what failed treatments you've tried) suggest lumbar radiculopathy (a pinched nerve in your low back) - specifically of L5 or S1. We will go ahead with an MRI to further assess. Stay as active as possible. I will call you the business day following the MRI to go over results and next steps.

## 2016-06-15 DIAGNOSIS — M545 Low back pain, unspecified: Secondary | ICD-10-CM | POA: Insufficient documentation

## 2016-06-15 NOTE — Assessment & Plan Note (Signed)
with pain in left ankle, hip, buttock as well.  Concerning for lumbar radiculopathy (S1, less likely L5).  She has already tried prednisone, nsaids, physical therapy, active release, chiropractic care without long lasting benefits.  Radiographs in past were normal.  Advised we go ahead with MRI lumbar spine to further assess.  Can take ibuprofen in meantime.
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
## 2016-06-19 ENCOUNTER — Ambulatory Visit (HOSPITAL_BASED_OUTPATIENT_CLINIC_OR_DEPARTMENT_OTHER)
Admission: RE | Admit: 2016-06-19 | Discharge: 2016-06-19 | Disposition: A | Discharge status: Home / Self Care | Payer: 59 | Source: Ambulatory Visit | Attending: Family Medicine | Admitting: Family Medicine

## 2016-06-19 DIAGNOSIS — M545 Low back pain: Secondary | ICD-10-CM

## 2016-06-19 DIAGNOSIS — M5137 Other intervertebral disc degeneration, lumbosacral region: Secondary | ICD-10-CM | POA: Diagnosis not present

## 2016-06-19 DIAGNOSIS — M5126 Other intervertebral disc displacement, lumbar region: Secondary | ICD-10-CM | POA: Diagnosis not present

## 2016-06-19 DIAGNOSIS — M5127 Other intervertebral disc displacement, lumbosacral region: Secondary | ICD-10-CM | POA: Insufficient documentation

## 2016-06-24 MED FILL — LORazepam 0.5 MG TABS: 0.5 | 30 days supply | Qty: 60 | Fill #2

## 2016-06-24 MED FILL — ARMODAFINIL 150 MG TABLET: 150 | 30 days supply | Qty: 30 | Fill #0

## 2016-06-24 MED FILL — VIT D2 1.25 MG (50,000 UNIT: 1.25 MG | 28 days supply | Qty: 4 | Fill #2 | Status: TO

## 2016-07-27 MED FILL — LORazepam 0.5 MG TABS: 0.5 | 30 days supply | Qty: 60 | Fill #3

## 2016-07-28 MED FILL — ARMODAFINIL 150 MG TABLET: 150 | 30 days supply | Qty: 30 | Fill #1 | Status: TO

## 2016-08-04 DIAGNOSIS — R509 Fever, unspecified: Secondary | ICD-10-CM | POA: Diagnosis not present

## 2016-08-04 DIAGNOSIS — J029 Acute pharyngitis, unspecified: Secondary | ICD-10-CM | POA: Diagnosis not present

## 2016-09-08 MED FILL — ARMODAFINIL 150 MG TABLET: 150 | 30 days supply | Qty: 30 | Fill #0

## 2016-09-09 MED FILL — LORazepam 0.5 MG TABS: 0.5 | 30 days supply | Qty: 60 | Fill #0

## 2016-10-07 MED FILL — VIT D2 1.25 MG (50,000 UNIT: 1.25 MG | 28 days supply | Qty: 4 | Fill #0

## 2016-10-07 MED FILL — LORazepam 0.5 MG TABS: 0.5 | 30 days supply | Qty: 60 | Fill #1

## 2016-10-07 MED FILL — ARMODAFINIL 150 MG TABLET: 150 | 30 days supply | Qty: 30 | Fill #1

## 2016-11-19 ENCOUNTER — Telehealth: Payer: Self-pay | Admitting: Family Medicine

## 2016-11-19 ENCOUNTER — Telehealth: Payer: Self-pay | Admitting: *Deleted

## 2016-11-19 DIAGNOSIS — M5442 Lumbago with sciatica, left side: Secondary | ICD-10-CM

## 2016-11-19 NOTE — Telephone Encounter (Signed)
[place]

## 2016-11-22 NOTE — Telephone Encounter (Signed)
Ok to go ahead with this. 

## 2016-11-22 NOTE — Telephone Encounter (Signed)
[place] faxed to [place] in [place].

## 2016-11-22 NOTE — Telephone Encounter (Signed)
Order faxed to PT in [place], [place]. 

## 2016-11-25 DIAGNOSIS — M25552 Pain in left hip: Secondary | ICD-10-CM | POA: Diagnosis not present

## 2016-11-25 DIAGNOSIS — M545 Low back pain: Secondary | ICD-10-CM | POA: Diagnosis not present

## 2016-12-01 DIAGNOSIS — M545 Low back pain: Secondary | ICD-10-CM | POA: Diagnosis not present

## 2016-12-01 DIAGNOSIS — M25552 Pain in left hip: Secondary | ICD-10-CM | POA: Diagnosis not present

## 2016-12-03 DIAGNOSIS — M545 Low back pain: Secondary | ICD-10-CM | POA: Diagnosis not present

## 2016-12-03 DIAGNOSIS — M25552 Pain in left hip: Secondary | ICD-10-CM | POA: Diagnosis not present

## 2016-12-03 MED FILL — ARMODAFINIL 150 MG TABLET: 150 | 30 days supply | Qty: 30 | Fill #2

## 2016-12-03 MED FILL — VIT D2 1.25 MG (50,000 UNIT: 1.25 MG | 28 days supply | Qty: 4 | Fill #1

## 2016-12-03 MED FILL — LORazepam 0.5 MG TABS: 0.5 | 30 days supply | Qty: 60 | Fill #2

## 2016-12-08 DIAGNOSIS — M25552 Pain in left hip: Secondary | ICD-10-CM | POA: Diagnosis not present

## 2016-12-08 DIAGNOSIS — M545 Low back pain: Secondary | ICD-10-CM | POA: Diagnosis not present

## 2016-12-14 DIAGNOSIS — M545 Low back pain: Secondary | ICD-10-CM | POA: Diagnosis not present

## 2016-12-14 DIAGNOSIS — M25552 Pain in left hip: Secondary | ICD-10-CM | POA: Diagnosis not present

## 2016-12-21 DIAGNOSIS — M545 Low back pain: Secondary | ICD-10-CM | POA: Diagnosis not present

## 2016-12-21 DIAGNOSIS — M25552 Pain in left hip: Secondary | ICD-10-CM | POA: Diagnosis not present

## 2016-12-24 DIAGNOSIS — M545 Low back pain: Secondary | ICD-10-CM | POA: Diagnosis not present

## 2016-12-24 DIAGNOSIS — M25552 Pain in left hip: Secondary | ICD-10-CM | POA: Diagnosis not present

## 2016-12-28 DIAGNOSIS — M25552 Pain in left hip: Secondary | ICD-10-CM | POA: Diagnosis not present

## 2016-12-28 DIAGNOSIS — M545 Low back pain: Secondary | ICD-10-CM | POA: Diagnosis not present

## 2016-12-31 DIAGNOSIS — M545 Low back pain: Secondary | ICD-10-CM | POA: Diagnosis not present

## 2016-12-31 DIAGNOSIS — M25552 Pain in left hip: Secondary | ICD-10-CM | POA: Diagnosis not present

## 2017-01-04 DIAGNOSIS — M25552 Pain in left hip: Secondary | ICD-10-CM | POA: Diagnosis not present

## 2017-01-04 DIAGNOSIS — M545 Low back pain: Secondary | ICD-10-CM | POA: Diagnosis not present

## 2017-01-07 DIAGNOSIS — M545 Low back pain: Secondary | ICD-10-CM | POA: Diagnosis not present

## 2017-01-07 DIAGNOSIS — M25552 Pain in left hip: Secondary | ICD-10-CM | POA: Diagnosis not present

## 2017-01-11 DIAGNOSIS — M545 Low back pain: Secondary | ICD-10-CM | POA: Diagnosis not present

## 2017-01-11 DIAGNOSIS — M25552 Pain in left hip: Secondary | ICD-10-CM | POA: Diagnosis not present

## 2017-01-14 DIAGNOSIS — M545 Low back pain: Secondary | ICD-10-CM | POA: Diagnosis not present

## 2017-01-14 DIAGNOSIS — M25552 Pain in left hip: Secondary | ICD-10-CM | POA: Diagnosis not present

## 2017-01-17 MED FILL — LORazepam 0.5 MG TABS: 0.5 | 30 days supply | Qty: 60 | Fill #3

## 2017-01-17 MED FILL — VIT D2 1.25 MG (50,000 UNIT: 1.25 MG | 28 days supply | Qty: 4 | Fill #2

## 2017-01-18 DIAGNOSIS — M25552 Pain in left hip: Secondary | ICD-10-CM | POA: Diagnosis not present

## 2017-01-18 DIAGNOSIS — M545 Low back pain: Secondary | ICD-10-CM | POA: Diagnosis not present

## 2017-01-20 DIAGNOSIS — M545 Low back pain: Secondary | ICD-10-CM | POA: Diagnosis not present

## 2017-01-20 DIAGNOSIS — M25552 Pain in left hip: Secondary | ICD-10-CM | POA: Diagnosis not present

## 2017-01-24 MED FILL — ARMODAFINIL 150 MG TABLET: 150 | 30 days supply | Qty: 30 | Fill #0

## 2017-01-25 DIAGNOSIS — M25552 Pain in left hip: Secondary | ICD-10-CM | POA: Diagnosis not present

## 2017-01-25 DIAGNOSIS — M545 Low back pain: Secondary | ICD-10-CM | POA: Diagnosis not present

## 2017-01-28 DIAGNOSIS — M25552 Pain in left hip: Secondary | ICD-10-CM | POA: Diagnosis not present

## 2017-01-28 DIAGNOSIS — M545 Low back pain: Secondary | ICD-10-CM | POA: Diagnosis not present

## 2017-02-01 DIAGNOSIS — M25552 Pain in left hip: Secondary | ICD-10-CM | POA: Diagnosis not present

## 2017-02-01 DIAGNOSIS — M545 Low back pain: Secondary | ICD-10-CM | POA: Diagnosis not present

## 2017-02-04 DIAGNOSIS — M25552 Pain in left hip: Secondary | ICD-10-CM | POA: Diagnosis not present

## 2017-02-10 DIAGNOSIS — M25552 Pain in left hip: Secondary | ICD-10-CM | POA: Diagnosis not present

## 2017-02-10 DIAGNOSIS — M545 Low back pain: Secondary | ICD-10-CM | POA: Diagnosis not present

## 2017-02-11 DIAGNOSIS — M545 Low back pain: Secondary | ICD-10-CM | POA: Diagnosis not present

## 2017-02-11 DIAGNOSIS — M25552 Pain in left hip: Secondary | ICD-10-CM | POA: Diagnosis not present

## 2017-02-15 DIAGNOSIS — M25552 Pain in left hip: Secondary | ICD-10-CM | POA: Diagnosis not present

## 2017-02-15 DIAGNOSIS — M545 Low back pain: Secondary | ICD-10-CM | POA: Diagnosis not present

## 2017-02-18 DIAGNOSIS — M545 Low back pain: Secondary | ICD-10-CM | POA: Diagnosis not present

## 2017-02-18 DIAGNOSIS — M25552 Pain in left hip: Secondary | ICD-10-CM | POA: Diagnosis not present

## 2017-02-25 DIAGNOSIS — M25552 Pain in left hip: Secondary | ICD-10-CM | POA: Diagnosis not present

## 2017-02-25 DIAGNOSIS — M545 Low back pain: Secondary | ICD-10-CM | POA: Diagnosis not present

## 2017-03-04 DIAGNOSIS — Z1231 Encounter for screening mammogram for malignant neoplasm of breast: Secondary | ICD-10-CM | POA: Diagnosis not present

## 2017-03-04 DIAGNOSIS — Z6827 Body mass index (BMI) 27.0-27.9, adult: Secondary | ICD-10-CM | POA: Diagnosis not present

## 2017-03-04 DIAGNOSIS — Z01419 Encounter for gynecological examination (general) (routine) without abnormal findings: Secondary | ICD-10-CM | POA: Diagnosis not present

## 2017-03-07 ENCOUNTER — Other Ambulatory Visit: Payer: Self-pay | Admitting: Internal Medicine

## 2017-03-07 MED FILL — VIT D2 1.25 MG (50,000 UNIT: 1.25 MG | 28 days supply | Qty: 4 | Fill #3

## 2017-03-09 DIAGNOSIS — Z01419 Encounter for gynecological examination (general) (routine) without abnormal findings: Secondary | ICD-10-CM | POA: Diagnosis not present

## 2017-03-09 DIAGNOSIS — M545 Low back pain: Secondary | ICD-10-CM | POA: Diagnosis not present

## 2017-03-09 DIAGNOSIS — M25552 Pain in left hip: Secondary | ICD-10-CM | POA: Diagnosis not present

## 2017-03-14 ENCOUNTER — Other Ambulatory Visit: Payer: Self-pay | Admitting: Internal Medicine

## 2017-03-17 DIAGNOSIS — M545 Low back pain: Secondary | ICD-10-CM | POA: Diagnosis not present

## 2017-03-17 DIAGNOSIS — M25552 Pain in left hip: Secondary | ICD-10-CM | POA: Diagnosis not present

## 2017-03-24 DIAGNOSIS — M545 Low back pain: Secondary | ICD-10-CM | POA: Diagnosis not present

## 2017-03-24 DIAGNOSIS — M25552 Pain in left hip: Secondary | ICD-10-CM | POA: Diagnosis not present

## 2017-04-11 DIAGNOSIS — Z0000 Encounter for general adult medical examination without abnormal findings: Secondary | ICD-10-CM | POA: Diagnosis not present

## 2017-04-11 DIAGNOSIS — E559 Vitamin D deficiency, unspecified: Secondary | ICD-10-CM | POA: Diagnosis not present

## 2017-04-18 DIAGNOSIS — Z0000 Encounter for general adult medical examination without abnormal findings: Secondary | ICD-10-CM | POA: Diagnosis not present

## 2017-04-22 MED FILL — LORazepam 0.5 MG TABS: 0.5 | 30 days supply | Qty: 60 | Fill #0

## 2017-04-22 MED FILL — ARMODAFINIL 150 MG TABLET: 150 | 30 days supply | Qty: 30 | Fill #0

## 2017-04-25 MED FILL — BUTALB-ACETAMIN-CAFF 50-300: 50-300-40 | 30 days supply | Qty: 30 | Fill #0

## 2017-04-25 MED FILL — VIT D2 1.25 MG (50,000 UNIT: 1.25 MG | 28 days supply | Qty: 4 | Fill #0

## 2017-06-15 MED FILL — LORazepam 0.5 MG TABS: 0.5 | 30 days supply | Qty: 60 | Fill #1

## 2017-06-15 MED FILL — VIT D2 1.25 MG (50,000 UNIT: 1.25 MG | 28 days supply | Qty: 4 | Fill #1

## 2017-06-15 MED FILL — ARMODAFINIL 150 MG TABLET: 150 | 30 days supply | Qty: 30 | Fill #1

## 2017-08-22 DIAGNOSIS — Z30433 Encounter for removal and reinsertion of intrauterine contraceptive device: Secondary | ICD-10-CM | POA: Diagnosis not present

## 2017-11-03 MED FILL — BACLOFEN 10 MG TABLET: 10 | 20 days supply | Qty: 60 | Fill #0

## 2017-11-03 MED FILL — VIT D2 1.25 MG (50,000 UNIT: 1.25 MG | 28 days supply | Qty: 4 | Fill #2

## 2018-01-17 MED FILL — ARMODAFINIL 150 MG TABLET: 150 | 30 days supply | Qty: 30 | Fill #0

## 2018-01-17 MED FILL — LORazepam 0.5 MG TABS: 0.5 | 30 days supply | Qty: 60 | Fill #0

## 2018-01-17 MED FILL — VIT D2 1.25 MG (50,000 UNIT: 1.25 MG | 28 days supply | Qty: 4 | Fill #3

## 2018-01-17 MED FILL — BUTALB-ACETAMIN-CAFF 50-300: 50-300-40 | 30 days supply | Qty: 30 | Fill #1

## 2018-03-15 ENCOUNTER — Telehealth: Payer: 59 | Admitting: Family

## 2018-03-15 DIAGNOSIS — R399 Unspecified symptoms and signs involving the genitourinary system: Secondary | ICD-10-CM | POA: Diagnosis not present

## 2018-03-15 MED ORDER — CEPHALEXIN 500 MG PO CAPS: 500.0000 mg | ORAL_CAPSULE | Freq: Two times a day (BID) | ORAL | 0 refills | Status: DC

## 2018-03-15 NOTE — Progress Notes (Signed)

## 2018-04-02 IMAGING — MR MR LUMBAR SPINE W/O CM
4 of 5 series · 28 of 48 positions shown · non-contrast
Comparison: None.

CLINICAL DATA: Low back pain for 5 years, increased 2 months ago.
LEFT-sided symptoms.

EXAM:
MRI LUMBAR SPINE WITHOUT CONTRAST
TECHNIQUE: Multiplanar, multisequence MR imaging of the lumbar spine was
performed. No intravenous contrast was administered.

[Series 2: T1 · sagittal · 4.0mm · 0.51mm/px · 6 of 14 slices shown (1 of 2)]
[im 1/14]
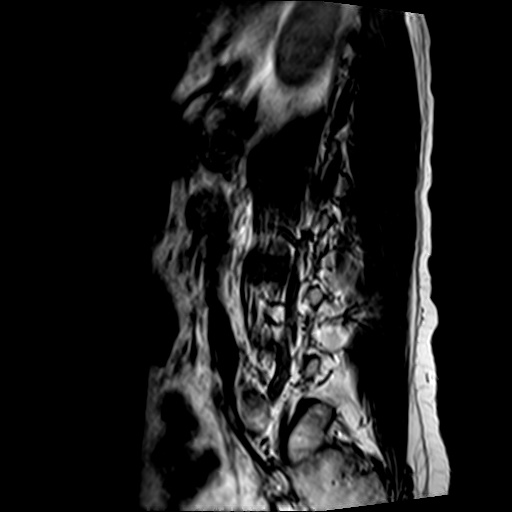
[im 3/14]
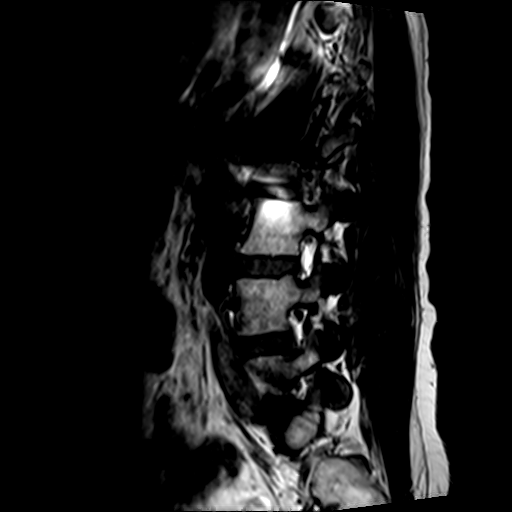
[im 6/14]
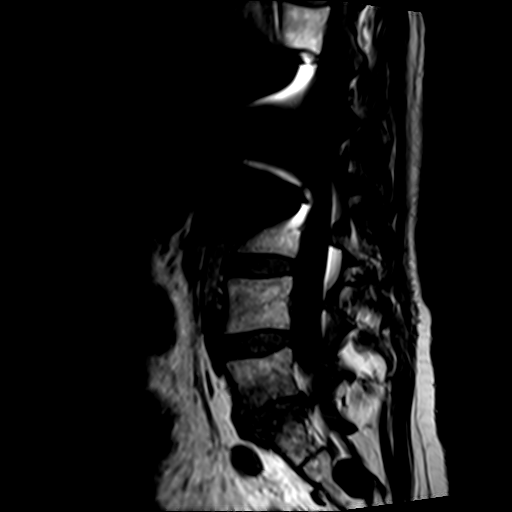
[im 8/14]
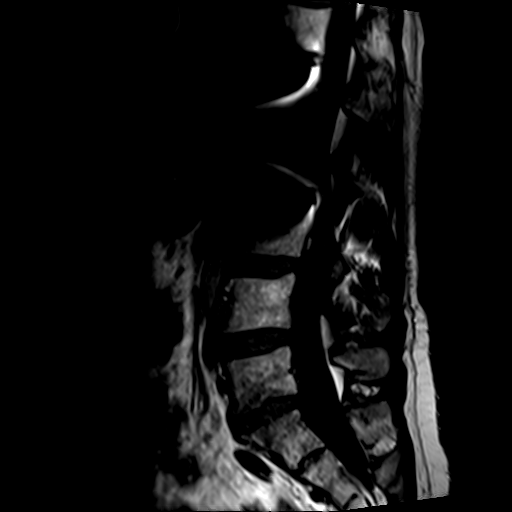
[im 11/14]
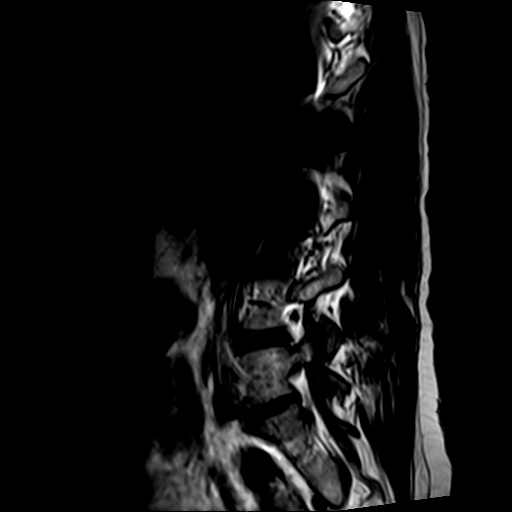
[im 14/14]
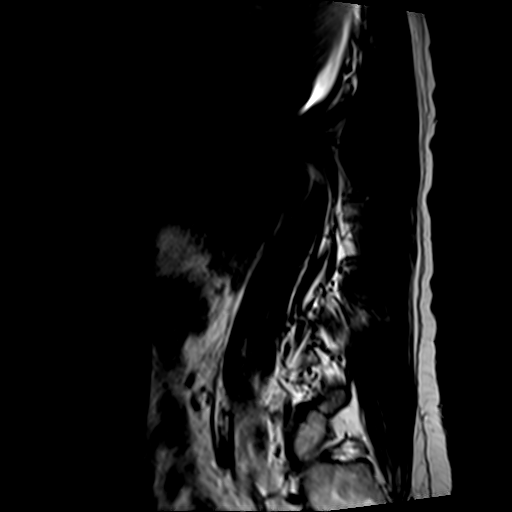

[Series 4: T2 · sagittal · 4.0mm · 0.81mm/px · 7 of 17 slices shown (1 of 2)]
[im 1/17]
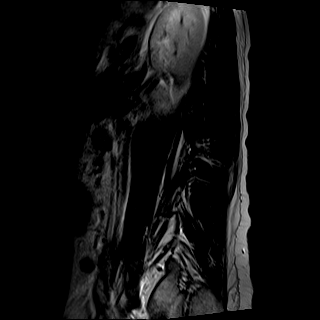
[im 3/17]
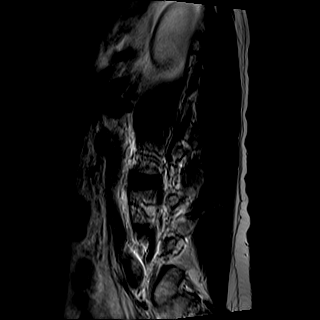
[im 6/17]
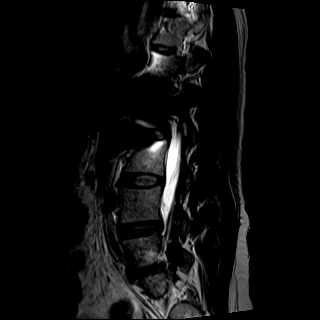
[im 9/17]
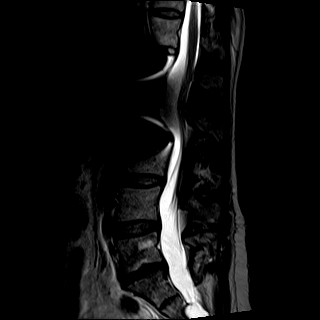
[im 11/17]
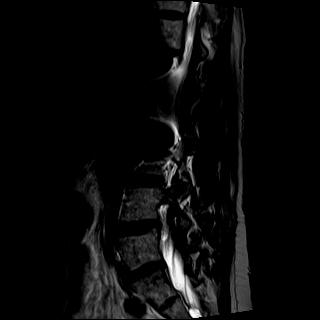
[im 14/17]
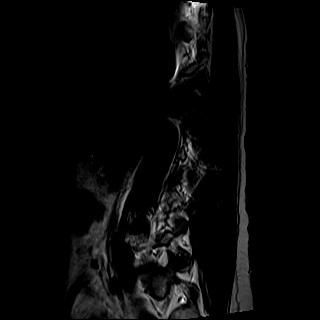
[im 17/17]
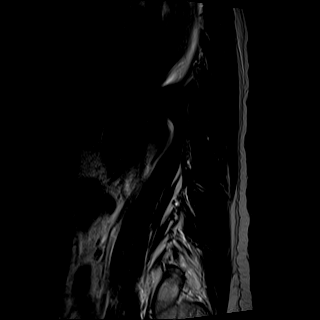

[Series 6: T2 · axial · 4.0mm · 0.39mm/px · z∈[-112,+65]mm · 8 of 34 slices shown (2 of 2)]
[im 1/34]
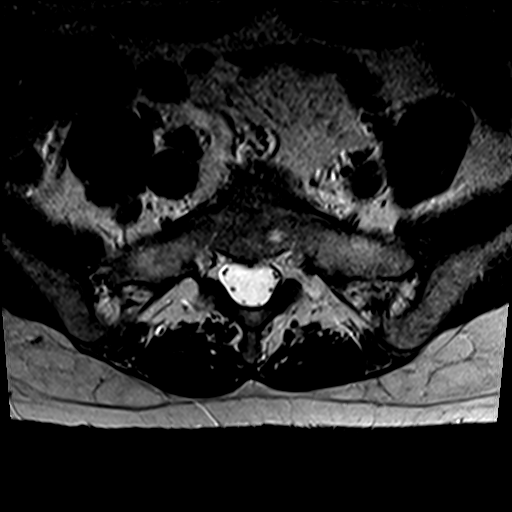
[im 6/34]
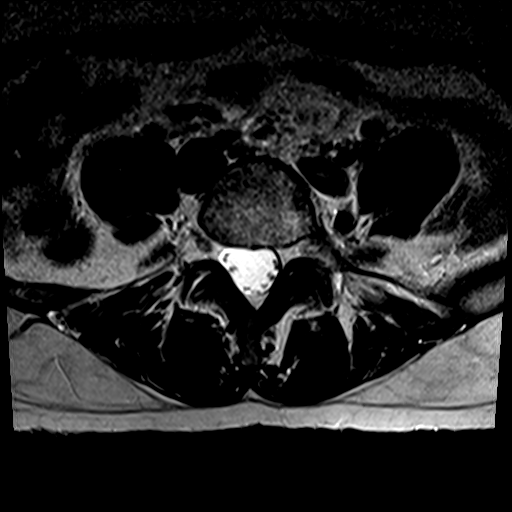
[im 11/34]
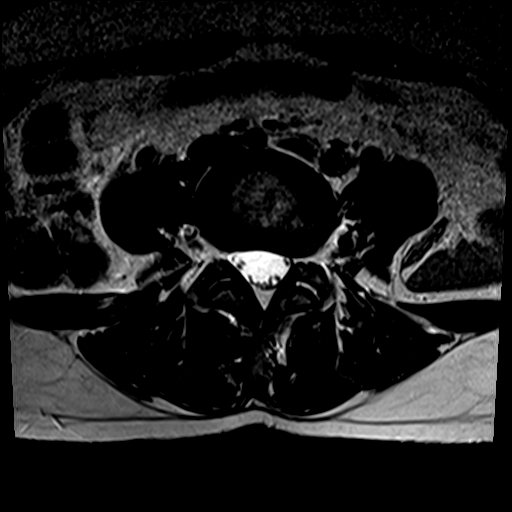
[im 16/34]
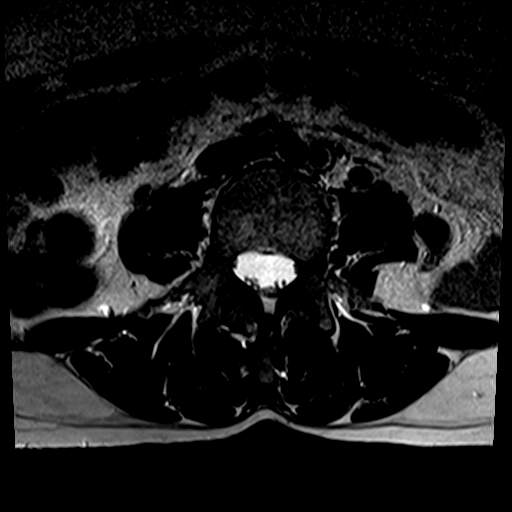
[im 18/34]
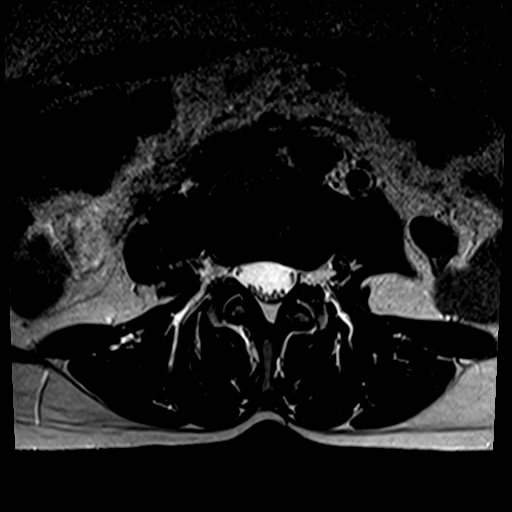
[im 23/34]
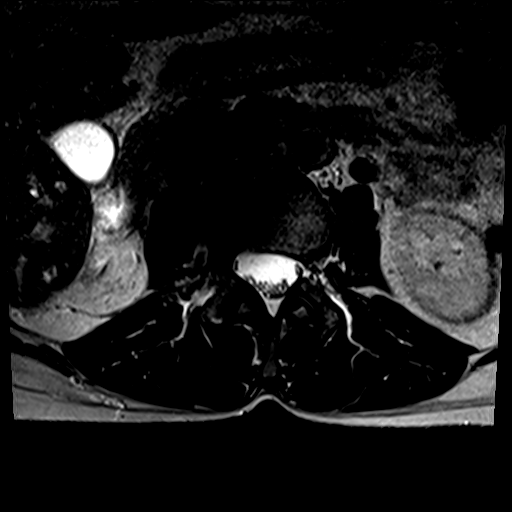
[im 28/34]
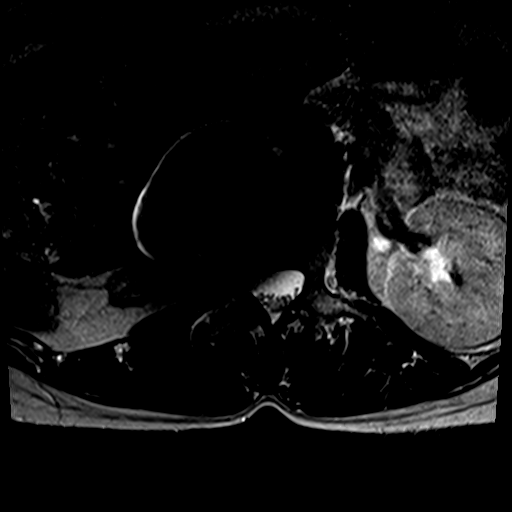
[im 34/34]
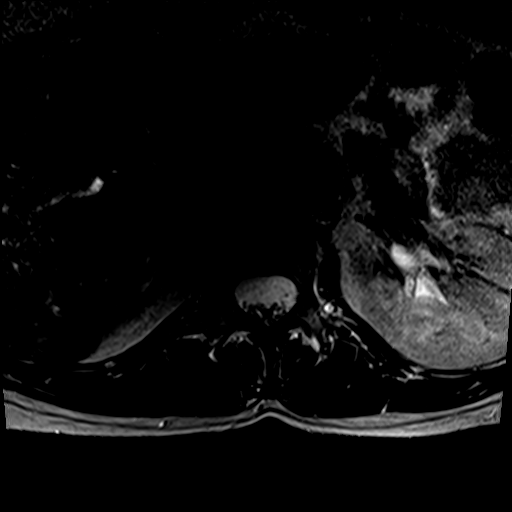

[Series 7: T1 · axial · 4.0mm · 0.78mm/px · z∈[-112,+35]mm · 7 of 34 slices shown (2 of 2)]
[im 1/34]
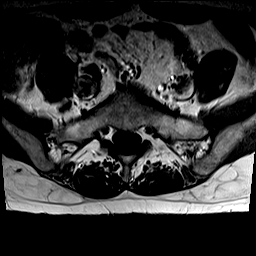
[im 6/34]
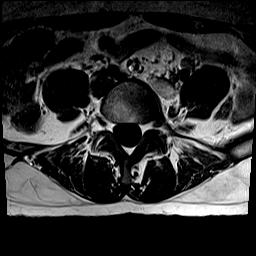
[im 11/34]
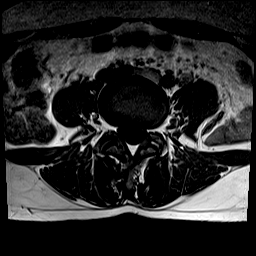
[im 16/34]
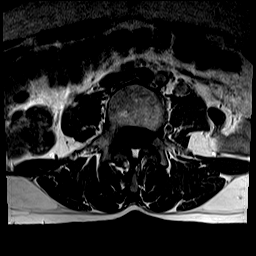
[im 18/34]
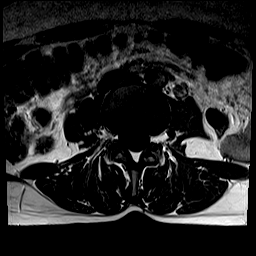
[im 23/34]
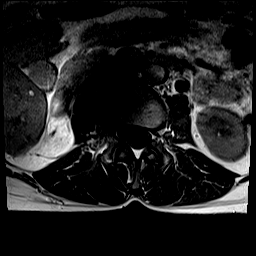
[im 28/34]
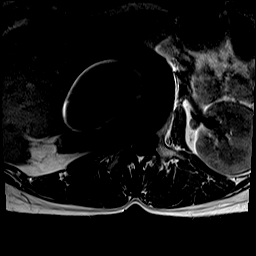

[28 of 48 positions shown; findings below may reference images not displayed]

FINDINGS: Segmentation:  Standard.

Alignment:  Physiologic.

Vertebrae: Susceptibility artifact from coil embolization of the
RIGHT kidney obscures the upper lumbar vertebrae. Degenerative
changes noted at L5-S1, typical of Modic type 1.

Conus medullaris: Extends to the L1 level and appears normal.

Paraspinal and other soft tissues: Unremarkable

Disc levels:

L1-L2:  Not visualized

L2-L3:  Partially visualized and appears normal

L3-L4:  Normal.

L4-L5:  Normal.

L5-S1: Disc space narrowing and desiccation. Shallow central and
leftward protrusion. LEFT subarticular zone narrowing (image 31
series 6) could irritate the LEFT S1 nerve root, but significant
compression is not established. Disc material also extends into the
LEFT neural foramen, without significant narrowing.
IMPRESSION: The exam is partially degraded by embolization coils in the upper
lumbar region, but overall the study is diagnostic with respect to
the L3 through S1 disc spaces.

Chronic degenerative change at L5-S1, with shallow central and
leftward protrusion. Correlate clinically for symptomatic LEFT S1
nerve irritation.

## 2018-04-24 DIAGNOSIS — M5432 Sciatica, left side: Secondary | ICD-10-CM | POA: Diagnosis not present

## 2018-04-24 DIAGNOSIS — F419 Anxiety disorder, unspecified: Secondary | ICD-10-CM | POA: Diagnosis not present

## 2018-04-24 DIAGNOSIS — M5430 Sciatica, unspecified side: Secondary | ICD-10-CM | POA: Diagnosis not present

## 2018-04-24 DIAGNOSIS — Z0000 Encounter for general adult medical examination without abnormal findings: Secondary | ICD-10-CM | POA: Diagnosis not present

## 2018-04-24 DIAGNOSIS — E559 Vitamin D deficiency, unspecified: Secondary | ICD-10-CM | POA: Diagnosis not present

## 2018-04-24 MED FILL — BACLOFEN 10 MG TABLET: 10 | 30 days supply | Qty: 90 | Fill #0

## 2018-04-24 MED FILL — BUTALB-ACETAMIN-CAFF 50-300: 50-300-40 | 30 days supply | Qty: 30 | Fill #0

## 2018-05-01 MED FILL — LORazepam 0.5 MG TABS: 0.5 | 30 days supply | Qty: 60 | Fill #1

## 2018-05-01 MED FILL — ARMODAFINIL 200 MG TABLET: 200 | 30 days supply | Qty: 30 | Fill #0

## 2018-07-04 MED FILL — BUTALB-ACETAMIN-CAFF 50-300: 50-300-40 | 30 days supply | Qty: 30 | Fill #1

## 2018-07-04 MED FILL — ARMODAFINIL 200 MG TABLET: 200 | 30 days supply | Qty: 30 | Fill #1

## 2018-07-04 MED FILL — LORazepam 0.5 MG TABS: 0.5 | 30 days supply | Qty: 60 | Fill #0

## 2018-09-25 MED FILL — BUTALB-ACETAMIN-CAFF 50-300: 50-300-40 | 30 days supply | Qty: 30 | Fill #2

## 2018-09-25 MED FILL — LORazepam 0.5 MG TABS: 0.5 | 30 days supply | Qty: 60 | Fill #1

## 2018-09-25 MED FILL — ARMODAFINIL 200 MG TABLET: 200 | 30 days supply | Qty: 30 | Fill #2

## 2018-11-29 MED FILL — BUTALB-ACETAMIN-CAFF 50-300: 50-300-40 | 30 days supply | Qty: 30 | Fill #3

## 2018-12-04 MED FILL — ARMODAFINIL 200 MG TABLET: 200 | 30 days supply | Qty: 30 | Fill #0

## 2018-12-04 MED FILL — LORazepam 0.5 MG TABS: 0.5 | 23 days supply | Qty: 45 | Fill #0

## 2019-01-22 ENCOUNTER — Telehealth: Payer: 59 | Admitting: Physician Assistant

## 2019-01-22 DIAGNOSIS — Z20822 Contact with and (suspected) exposure to covid-19: Secondary | ICD-10-CM

## 2019-01-22 DIAGNOSIS — R0602 Shortness of breath: Secondary | ICD-10-CM

## 2019-01-22 DIAGNOSIS — Z20828 Contact with and (suspected) exposure to other viral communicable diseases: Secondary | ICD-10-CM

## 2019-01-22 MED ORDER — ALBUTEROL SULFATE HFA 108 (90 BASE) MCG/ACT IN AERS: 2.0000 | INHALATION_SPRAY | RESPIRATORY_TRACT | 0 refills | Status: DC | PRN

## 2019-01-22 MED ORDER — BENZONATATE 100 MG PO CAPS: 100.0000 mg | ORAL_CAPSULE | Freq: Two times a day (BID) | ORAL | 0 refills | Status: DC | PRN

## 2019-01-22 NOTE — Progress Notes (Signed)
E-Visit for Corona Virus Screening  Based on your current symptoms, you may very well have the virus, however your symptoms are mild. Currently, not all patients are being tested. If the symptoms are mild and there is not a known exposure, performing the test is not indicated.  Coronavirus disease 2019 (COVID-19) is a respiratory illness that can spread from person to person. The virus that causes COVID-19 is a new virus that was first identified in the country of [place] but is now found in multiple other countries and has spread to the [place].  Symptoms associated with the virus are mild to severe fever, cough, and shortness of breath. There is currently no vaccine to protect against COVID-19, and there is no specific antiviral treatment for the virus.   To be considered HIGH RISK for Coronavirus (COVID-19), you have to meet the following criteria:  . Traveled to [place], [place], [place], [place] or [place]; or in the [place] to [place], [place], [place], or [place]; and have fever, cough, and shortness of breath within the last 2 weeks of travel OR  . Been in close contact with a person diagnosed with COVID-19 within the last 2 weeks and have fever, cough, and shortness of breath  . IF YOU DO NOT MEET THESE CRITERIA, YOU ARE CONSIDERED LOW RISK FOR COVID-19.   It is vitally important that if you feel that you have an infection such as this virus or any other virus that you stay home and away from places where you may spread it to others.  You should self-quarantine for 14 days if you have symptoms that could potentially be coronavirus and avoid contact with people age 44 and older.   You can use medication such as A prescription inhaler called Albuterol MDI 90 mcg /actuation 2 puffs every 4 hours as needed for shortness of breath, wheezing, cough  You may also take acetaminophen (Tylenol) as needed for fever.   Reduce your risk of any infection by using the same precautions  used for avoiding the common cold or flu:  Marland Kitchen Wash your hands often with soap and warm water for at least 20 seconds.  If soap and water are not readily available, use an alcohol-based hand sanitizer with at least 60% alcohol.  . If coughing or sneezing, cover your mouth and nose by coughing or sneezing into the elbow areas of your shirt or coat, into a tissue or into your sleeve (not your hands). . Avoid shaking hands with others and consider head nods or verbal greetings only. . Avoid touching your eyes, nose, or mouth with unwashed hands.  . Avoid close contact with people who are sick. . Avoid places or events with large numbers of people in one location, like concerts or sporting events. . Carefully consider travel plans you have or are making. . If you are planning any travel outside or inside the [place], visit the CDC's Travelers' Health webpage for the latest health notices. . If you have some symptoms but not all symptoms, continue to monitor at home and seek medical attention if your symptoms worsen. . If you are having a medical emergency, call 911.  HOME CARE . Only take medications as instructed by your medical team. . Drink plenty of fluids and get plenty of rest. . A steam or ultrasonic humidifier can help if you have congestion.   GET HELP RIGHT AWAY IF: . You develop worsening fever. . You become short of breath . You  cough up blood. . Your symptoms become more severe MAKE SURE YOU   Understand these instructions.  Will watch your condition.  Will get help right away if you are not doing well or get worse.  Your e-visit answers were reviewed by a board certified advanced clinical practitioner to complete your personal care plan.  Depending on the condition, your plan could have included both over the counter or prescription medications.  If there is a problem please reply once you have received a response from your provider. Your safety is important to us.  If you have drug  allergies check your prescription carefully.    You can use MyChart to ask questions about today's visit, request a non-urgent call back, or ask for a work or school excuse for 24 hours related to this e-Visit. If it has been greater than 24 hours you will need to follow up with your provider, or enter a new e-Visit to address those concerns. You will get an e-mail in the next two days asking about your experience.  I hope that your e-visit has been valuable and will speed your recovery. Thank you for using e-visits.   I have spent 7 min in completion and review of this note- Burnadette Baskett PAC    

## 2019-04-06 MED FILL — BUTALB-ACETAMIN-CAFF 50-300: 50-300-40 | 30 days supply | Qty: 30 | Fill #4

## 2019-04-06 MED FILL — LORazepam 0.5 MG TABS: 0.5 | 23 days supply | Qty: 45 | Fill #0

## 2019-04-06 MED FILL — ARMODAFINIL 200 MG TABLET: 200 | 30 days supply | Qty: 30 | Fill #0

## 2019-04-24 DIAGNOSIS — E559 Vitamin D deficiency, unspecified: Secondary | ICD-10-CM | POA: Diagnosis not present

## 2019-04-24 DIAGNOSIS — Z0000 Encounter for general adult medical examination without abnormal findings: Secondary | ICD-10-CM | POA: Diagnosis not present

## 2019-05-18 MED FILL — ARMODAFINIL 200 MG TABLET: 200 | 30 days supply | Qty: 30 | Fill #1

## 2019-05-21 MED FILL — BUTALB-ACETAMIN-CAFF 50-300: 50-300-40 | 30 days supply | Qty: 30 | Fill #0

## 2019-05-22 MED FILL — LORazepam 0.5 MG TABS: 0.5 | 22 days supply | Qty: 45 | Fill #0

## 2019-06-13 DIAGNOSIS — Z1231 Encounter for screening mammogram for malignant neoplasm of breast: Secondary | ICD-10-CM | POA: Diagnosis not present

## 2019-06-13 DIAGNOSIS — G43909 Migraine, unspecified, not intractable, without status migrainosus: Secondary | ICD-10-CM | POA: Insufficient documentation

## 2019-06-13 DIAGNOSIS — Z01419 Encounter for gynecological examination (general) (routine) without abnormal findings: Secondary | ICD-10-CM | POA: Diagnosis not present

## 2019-06-13 DIAGNOSIS — Z6828 Body mass index (BMI) 28.0-28.9, adult: Secondary | ICD-10-CM | POA: Diagnosis not present

## 2019-06-13 DIAGNOSIS — Z8742 Personal history of other diseases of the female genital tract: Secondary | ICD-10-CM | POA: Insufficient documentation

## 2019-08-21 MED FILL — LORazepam 0.5 MG TABS: 0.5 | 22 days supply | Qty: 45 | Fill #1

## 2019-09-19 DIAGNOSIS — H52223 Regular astigmatism, bilateral: Secondary | ICD-10-CM | POA: Diagnosis not present

## 2021-10-11 ENCOUNTER — Emergency Department (HOSPITAL_COMMUNITY): Payer: 59

## 2021-10-11 ENCOUNTER — Encounter (HOSPITAL_COMMUNITY): Payer: Self-pay

## 2021-10-11 ENCOUNTER — Other Ambulatory Visit: Payer: Self-pay

## 2021-10-11 ENCOUNTER — Emergency Department (HOSPITAL_COMMUNITY)
Admission: EM | Admit: 2021-10-11 | Discharge: 2021-10-11 | Disposition: A | Discharge status: Home / Self Care | Payer: 59 | Attending: Emergency Medicine | Admitting: Emergency Medicine

## 2021-10-11 DIAGNOSIS — N23 Unspecified renal colic: Secondary | ICD-10-CM

## 2021-10-11 DIAGNOSIS — R1032 Left lower quadrant pain: Secondary | ICD-10-CM | POA: Diagnosis present

## 2021-10-11 DIAGNOSIS — I700 Atherosclerosis of aorta: Secondary | ICD-10-CM | POA: Diagnosis not present

## 2021-10-11 DIAGNOSIS — N132 Hydronephrosis with renal and ureteral calculous obstruction: Secondary | ICD-10-CM | POA: Insufficient documentation

## 2021-10-11 DIAGNOSIS — N261 Atrophy of kidney (terminal): Secondary | ICD-10-CM | POA: Diagnosis not present

## 2021-10-11 LAB — URINALYSIS, ROUTINE W REFLEX MICROSCOPIC
Bilirubin Urine: NEGATIVE
Glucose, UA: NEGATIVE mg/dL
Hgb urine dipstick: NEGATIVE
Ketones, ur: NEGATIVE mg/dL
Leukocytes,Ua: NEGATIVE
Nitrite: NEGATIVE
Protein, ur: NEGATIVE mg/dL
Specific Gravity, Urine: 1.003 — ABNORMAL LOW (ref 1.005–1.030)
pH: 7 (ref 5.0–8.0)

## 2021-10-11 LAB — BASIC METABOLIC PANEL
Anion gap: 7 (ref 5–15)
BUN: 14 mg/dL (ref 6–20)
CO2: 26 mmol/L (ref 22–32)
Calcium: 9.3 mg/dL (ref 8.9–10.3)
Chloride: 105 mmol/L (ref 98–111)
Creatinine, Ser: 0.86 mg/dL (ref 0.44–1.00)
GFR, Estimated: >60 mL/min (ref >60)
Glucose, Bld: 98 mg/dL (ref 70–99)
Potassium: 3.9 mmol/L (ref 3.5–5.1)
Sodium: 138 mmol/L (ref 135–145)

## 2021-10-11 LAB — CBC WITH DIFFERENTIAL/PLATELET
Abs Immature Granulocytes: 0.02 10*3/uL (ref 0.00–0.07)
Basophils Absolute: 0 10*3/uL (ref 0.0–0.1)
Basophils Relative: 0 %
Eosinophils Absolute: 0.1 10*3/uL (ref 0.0–0.5)
Eosinophils Relative: 1 %
HCT: 40.3 % (ref 36.0–46.0)
Hemoglobin: 13.5 g/dL (ref 12.0–15.0)
Immature Granulocytes: 0 %
Lymphocytes Relative: 10 %
Lymphs Abs: 1 10*3/uL (ref 0.7–4.0)
MCH: 29.5 pg (ref 26.0–34.0)
MCHC: 33.5 g/dL (ref 30.0–36.0)
MCV: 88 fL (ref 80.0–100.0)
Monocytes Absolute: 0.8 10*3/uL (ref 0.1–1.0)
Monocytes Relative: 7 %
Neutro Abs: 8.2 10*3/uL — ABNORMAL HIGH (ref 1.7–7.7)
Neutrophils Relative %: 82 %
Platelets: 234 10*3/uL (ref 150–400)
RBC: 4.58 MIL/uL (ref 3.87–5.11)
RDW: 12.7 % (ref 11.5–15.5)
WBC: 10.1 10*3/uL (ref 4.0–10.5)
nRBC: 0 % (ref 0.0–0.2)

## 2021-10-11 LAB — PREGNANCY, URINE: Preg Test, Ur: NEGATIVE

## 2021-10-11 MED ORDER — ONDANSETRON HCL 4 MG/2ML IJ SOLN
4.0000 mg | Freq: Once | INTRAMUSCULAR | Status: AC
  Administered 2021-10-11: 4 mg via INTRAVENOUS
  Filled 2021-10-11: qty 2

## 2021-10-11 MED ORDER — FENTANYL CITRATE PF 50 MCG/ML IJ SOSY
50.0000 ug | PREFILLED_SYRINGE | Freq: Once | INTRAMUSCULAR | Status: AC
  Administered 2021-10-11: 50 ug via INTRAVENOUS
  Filled 2021-10-11: qty 1

## 2021-10-11 MED ORDER — SODIUM CHLORIDE 0.9 % IV BOLUS
1000.0000 mL | Freq: Once | INTRAVENOUS | Status: AC
  Administered 2021-10-11: 1000 mL via INTRAVENOUS

## 2021-10-11 NOTE — ED Triage Notes (Signed)
Patient complains of LLQ pain that started this am after leaving work. Complains of left flank pain but often has back pain so unsure if different. Nausea with same. Denies trauma. Remote hx of stone.

## 2021-10-11 NOTE — ED Notes (Signed)
Lab to add on urine preg.
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
## 2021-10-11 NOTE — ED Notes (Signed)
Pt verbalizes understanding of discharge instructions. Opportunity for questions and answers were provided. Pt discharged from the ED.   ?

## 2022-03-11 ENCOUNTER — Other Ambulatory Visit (HOSPITAL_COMMUNITY): Payer: Self-pay

## 2022-03-11 DIAGNOSIS — Z124 Encounter for screening for malignant neoplasm of cervix: Secondary | ICD-10-CM | POA: Diagnosis not present

## 2022-03-11 DIAGNOSIS — Z1211 Encounter for screening for malignant neoplasm of colon: Secondary | ICD-10-CM | POA: Diagnosis not present

## 2022-03-11 DIAGNOSIS — N959 Unspecified menopausal and perimenopausal disorder: Secondary | ICD-10-CM | POA: Diagnosis not present

## 2022-03-11 DIAGNOSIS — Z1231 Encounter for screening mammogram for malignant neoplasm of breast: Secondary | ICD-10-CM | POA: Diagnosis not present

## 2022-03-11 DIAGNOSIS — Z6832 Body mass index (BMI) 32.0-32.9, adult: Secondary | ICD-10-CM | POA: Diagnosis not present

## 2022-03-11 DIAGNOSIS — Z01419 Encounter for gynecological examination (general) (routine) without abnormal findings: Secondary | ICD-10-CM | POA: Diagnosis not present

## 2022-03-11 LAB — HM MAMMOGRAPHY

## 2022-03-11 LAB — HM PAP SMEAR: HM Pap smear: NEGATIVE

## 2022-03-11 MED ORDER — ESTRADIOL 2 MG PO TABS
2.0000 mg | ORAL_TABLET | Freq: Every day | ORAL | 12 refills | Status: DC
  Filled 2022-03-11: qty 30, 30d supply, fill #0

## 2022-05-13 ENCOUNTER — Ambulatory Visit: Payer: 59 | Admitting: Nurse Practitioner

## 2022-05-28 ENCOUNTER — Encounter: Payer: Self-pay | Admitting: Nurse Practitioner

## 2022-05-28 ENCOUNTER — Ambulatory Visit (INDEPENDENT_AMBULATORY_CARE_PROVIDER_SITE_OTHER): Payer: 59 | Admitting: Nurse Practitioner

## 2022-05-28 ENCOUNTER — Other Ambulatory Visit (HOSPITAL_COMMUNITY): Payer: Self-pay

## 2022-05-28 VITALS — BP 110/76 | HR 76 | Temp 97.6°F | Resp 12 | Ht 68.0 in | Wt 212.1 lb

## 2022-05-28 DIAGNOSIS — E559 Vitamin D deficiency, unspecified: Secondary | ICD-10-CM | POA: Insufficient documentation

## 2022-05-28 DIAGNOSIS — Z0000 Encounter for general adult medical examination without abnormal findings: Secondary | ICD-10-CM

## 2022-05-28 DIAGNOSIS — E6609 Other obesity due to excess calories: Secondary | ICD-10-CM | POA: Diagnosis not present

## 2022-05-28 DIAGNOSIS — Z6832 Body mass index (BMI) 32.0-32.9, adult: Secondary | ICD-10-CM

## 2022-05-28 DIAGNOSIS — G43009 Migraine without aura, not intractable, without status migrainosus: Secondary | ICD-10-CM | POA: Diagnosis not present

## 2022-05-28 LAB — CBC
HCT: 40.6 % (ref 36.0–46.0)
Hemoglobin: 13.4 g/dL (ref 12.0–15.0)
MCHC: 33.1 g/dL (ref 30.0–36.0)
MCV: 86.6 fl (ref 78.0–100.0)
Platelets: 249 10*3/uL (ref 150.0–400.0)
RBC: 4.69 Mil/uL (ref 3.87–5.11)
RDW: 13.5 % (ref 11.5–15.5)
WBC: 5.8 10*3/uL (ref 4.0–10.5)

## 2022-05-28 LAB — LIPID PANEL
Cholesterol: 179 mg/dL (ref 0–200)
HDL: 42.6 mg/dL (ref >39.00)
LDL Cholesterol: 118 mg/dL — ABNORMAL HIGH (ref 0–99)
NonHDL: 136.4
Total CHOL/HDL Ratio: 4
Triglycerides: 92 mg/dL (ref 0.0–149.0)
VLDL: 18.4 mg/dL (ref 0.0–40.0)

## 2022-05-28 LAB — COMPREHENSIVE METABOLIC PANEL
ALT: 24 U/L (ref 0–35)
AST: 18 U/L (ref 0–37)
Albumin: 4.5 g/dL (ref 3.5–5.2)
Alkaline Phosphatase: 70 U/L (ref 39–117)
BUN: 14 mg/dL (ref 6–23)
CO2: 30 mEq/L (ref 19–32)
Calcium: 9.7 mg/dL (ref 8.4–10.5)
Chloride: 104 mEq/L (ref 96–112)
Creatinine, Ser: 0.93 mg/dL (ref 0.40–1.20)
GFR: 72 mL/min (ref >60.00)
Glucose, Bld: 67 mg/dL — ABNORMAL LOW (ref 70–99)
Potassium: 3.9 mEq/L (ref 3.5–5.1)
Sodium: 141 mEq/L (ref 135–145)
Total Bilirubin: 0.3 mg/dL (ref 0.2–1.2)
Total Protein: 7.2 g/dL (ref 6.0–8.3)

## 2022-05-28 LAB — HEMOGLOBIN A1C: Hgb A1c MFr Bld: 5.4 % (ref 4.6–6.5)

## 2022-05-28 LAB — TSH: TSH: 1.79 u[IU]/mL (ref 0.35–5.50)

## 2022-05-28 MED ORDER — BUTALBITAL-APAP-CAFFEINE 50-325-40 MG PO TABS
1.0000 | ORAL_TABLET | Freq: Every day | ORAL | 0 refills | Status: DC | PRN
  Filled 2022-05-28 – 2022-06-11 (×2): qty 30, 30d supply, fill #0

## 2022-05-28 NOTE — Assessment & Plan Note (Signed)
Discussed age-appropriate immunizations and screening exams. 
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
## 2022-05-28 NOTE — Assessment & Plan Note (Signed)
Patient is currently being repleted

## 2022-05-28 NOTE — Patient Instructions (Signed)
Nice to see you today I will be in touch with the labs once I have the results Follow up with me in 1 year for your next physical, sooner if you need me 

## 2022-05-28 NOTE — Assessment & Plan Note (Signed)
Has been maintained on Fioricet in the past.  States a prescription of 30 has lasted her for 3 years.  Refill placed today

## 2022-05-31 ENCOUNTER — Encounter: Payer: Self-pay | Admitting: Nurse Practitioner

## 2022-06-07 ENCOUNTER — Other Ambulatory Visit (HOSPITAL_COMMUNITY): Payer: Self-pay

## 2022-06-11 ENCOUNTER — Other Ambulatory Visit (HOSPITAL_COMMUNITY): Payer: Self-pay

## 2022-06-22 ENCOUNTER — Encounter: Payer: Self-pay | Admitting: Obstetrics and Gynecology

## 2022-06-25 ENCOUNTER — Encounter: Payer: Self-pay | Admitting: Gastroenterology

## 2022-09-08 ENCOUNTER — Other Ambulatory Visit: Payer: Self-pay | Admitting: Nurse Practitioner

## 2022-09-08 ENCOUNTER — Other Ambulatory Visit (HOSPITAL_COMMUNITY): Payer: Self-pay

## 2022-09-08 DIAGNOSIS — G43009 Migraine without aura, not intractable, without status migrainosus: Secondary | ICD-10-CM

## 2022-09-08 MED ORDER — BUTALBITAL-APAP-CAFFEINE 50-325-40 MG PO TABS
1.0000 | ORAL_TABLET | Freq: Every day | ORAL | 0 refills | Status: DC | PRN
  Filled 2022-09-08: qty 30, 30d supply, fill #0

## 2022-11-18 ENCOUNTER — Encounter: Payer: Self-pay | Admitting: Psychology

## 2022-11-18 ENCOUNTER — Ambulatory Visit (INDEPENDENT_AMBULATORY_CARE_PROVIDER_SITE_OTHER): Payer: Commercial Managed Care - PPO | Admitting: Psychology

## 2022-11-18 DIAGNOSIS — F419 Anxiety disorder, unspecified: Secondary | ICD-10-CM
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
## 2022-11-18 NOTE — Progress Notes (Signed)
                Renold Kozar, PhD 

## 2022-12-16 ENCOUNTER — Other Ambulatory Visit (HOSPITAL_COMMUNITY): Payer: Self-pay

## 2022-12-16 ENCOUNTER — Encounter: Payer: Self-pay | Admitting: Nurse Practitioner

## 2022-12-16 DIAGNOSIS — Z6832 Body mass index (BMI) 32.0-32.9, adult: Secondary | ICD-10-CM

## 2022-12-16 MED ORDER — ZEPBOUND 2.5 MG/0.5ML ~~LOC~~ SOAJ
2.5000 mg | SUBCUTANEOUS | 0 refills | Status: DC
  Filled 2022-12-16 – 2022-12-30 (×4): qty 2, 28d supply, fill #0

## 2022-12-17 ENCOUNTER — Other Ambulatory Visit (HOSPITAL_COMMUNITY): Payer: Self-pay

## 2022-12-27 ENCOUNTER — Other Ambulatory Visit (HOSPITAL_COMMUNITY): Payer: Self-pay

## 2022-12-28 ENCOUNTER — Other Ambulatory Visit (HOSPITAL_COMMUNITY): Payer: Self-pay

## 2022-12-28 ENCOUNTER — Ambulatory Visit (INDEPENDENT_AMBULATORY_CARE_PROVIDER_SITE_OTHER): Payer: Commercial Managed Care - PPO | Admitting: Psychology

## 2022-12-28 ENCOUNTER — Encounter: Payer: Self-pay | Admitting: Psychology

## 2022-12-28 DIAGNOSIS — F419 Anxiety disorder, unspecified: Secondary | ICD-10-CM
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
## 2022-12-28 NOTE — Telephone Encounter (Signed)
Pt called asking for status of prior auth? Call back # [phone]

## 2022-12-28 NOTE — Progress Notes (Signed)
                Sejal Cofield, PhD 

## 2022-12-29 ENCOUNTER — Other Ambulatory Visit (HOSPITAL_COMMUNITY): Payer: Self-pay

## 2022-12-29 ENCOUNTER — Encounter: Payer: Self-pay | Admitting: Psychology

## 2022-12-29 ENCOUNTER — Telehealth: Payer: Self-pay

## 2022-12-29 NOTE — Telephone Encounter (Signed)
Noted  

## 2022-12-29 NOTE — Progress Notes (Signed)
Angela Mccann is a 52 y.o. female patient Report writing competed ( 3 hrs.).  Interactive feedback to be conducted next session. Report to be attached to the feedback progress note.  Patient/Guardian was advised Release of Information must be obtained prior to any record release in order to collaborate their care with an outside provider. Patient/Guardian was advised if they have not already done so to contact the registration department to sign all necessary forms in order for [place] to release information regarding their care.   Consent: Patient/Guardian gives verbal consent for treatment and assignment of benefits for services provided during this visit. Patient/Guardian expressed understanding and agreed to proceed.    Rainey Pines, PhD

## 2022-12-29 NOTE — Telephone Encounter (Signed)
Pharmacy Patient Advocate Encounter   Received notification that prior authorization for Zepbound 2.'5MG'$ /0.5ML pen-injectors is required/requested.  Per Test Claim: PA required   PA submitted on 12/29/22 to (ins) [employer] via CoverMyMeds Key [id number] Status is pending

## 2022-12-30 ENCOUNTER — Other Ambulatory Visit: Payer: Self-pay

## 2022-12-30 ENCOUNTER — Other Ambulatory Visit (HOSPITAL_COMMUNITY): Payer: Self-pay

## 2022-12-30 NOTE — Telephone Encounter (Signed)
Patient Advocate Encounter  Prior Authorization for Zepbound 2.'5MG'$ /0.5ML pen-injectors has been approved.    PA# [id number] Effective dates: 12/29/22 through 06/27/23

## 2022-12-30 NOTE — Telephone Encounter (Signed)
Called and informed pt that PA was approved.

## 2023-01-03 ENCOUNTER — Ambulatory Visit (INDEPENDENT_AMBULATORY_CARE_PROVIDER_SITE_OTHER): Payer: Commercial Managed Care - PPO | Admitting: Psychology

## 2023-01-03 ENCOUNTER — Encounter: Payer: Self-pay | Admitting: Psychology

## 2023-01-03 DIAGNOSIS — F341 Dysthymic disorder: Secondary | ICD-10-CM | POA: Diagnosis not present

## 2023-01-03 DIAGNOSIS — F4310 Post-traumatic stress disorder, unspecified: Secondary | ICD-10-CM
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
## 2023-01-03 NOTE — Progress Notes (Signed)
                Angelene Rome, PhD 

## 2023-01-03 NOTE — Progress Notes (Signed)
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 enhanced by building on an individual's existing strategies, systematically training the new strategies, and  tailoring the compensatory strategies to the individual's unique needs and environmental contexts. More frequent use of aides or strategies and the use of a greater variety of aides is helpful when it comes to memory, and this also may hold true for executive dysfunction.   Adherence to routines and resistance to change may reflect Ms. Kurpiewski' need for predictability in her environment. An essential tenet of intervention is to facilitate feelings of security by maintaining a set of basic routines, then adjusting routines slightly in a stepwise fashion. Larger steps may provoke resistance and distress.  An individual who has difficulties shifting set can often adjust to changes in schedule or routine with the use of visual organizers such as pictures, schedules, planners, and calendar boards. This will let Ms. Garth know the order of activities  for the day and can alert her to variations in the usual sequence of events before they occur.  Displaying a daily schedule and reviewing it at the outset of the day can help an adult like Ms. August anticipate the sequence of events and can serve as a useful reminder of any changes in her daily routine. Any changes in scheduled activities, persons, or events can be placed on Ms. Pianka's schedule and called to her attention with as much advance notice as possible. This provides more time for her to adjust to the change.    An individual who has difficulties shifting attention often needs to focus on only one task at a time. Presenting one task at a time and limiting choices to only one or two may be helpful.  Ms. Aston might benefit from practicing her mental flexibility. Working with two or three familiar tasks and rotating them at regular intervals may help develop greater flexibility and help her become more accustomed to shifting.  Ms. Trumbull might benefit from external prompting to shift attention, behavior, or cognitive set from one activity or focus to the next. This may include, for example, the use of a timer on a watch or other device.  Some individuals can benefit from set time limits for each task before a shift to the next task is required. Maniah Kratochvil might work on one activity or assignment for a set period then an alternative activity for the next period. Use of a timer can facilitate Ms. Chizek's adjustment to change in activity.  Having regularly scheduled times during the day in which different tasks are carried out, separated by rest breaks or leisure activities, can facilitate shifting. For example, Ms. Grisez might work on task A each morning and task B, each afternoon.  Sometimes working in small groups or being paired with another person can help individuals shift their focus or cognitive set. Others can model that it is time to change, cuing Ms. Laine by their behavior.  Individuals  such as Ms. Sime have difficulty monitoring the impact of their behavior on others. Having a supportive partner/therapist/teacher provide her with subtle cues to help recognize the effects of her behavior may be helpful. Similarly, providing opportunities for self-monitoring in contexts where she can receive constructive feedback, such as group therapy or social skills training, may also prove beneficial.  Ms. Luger may not be able to consider the impact of her behavior in the immediate situation. It may be helpful or necessary to discuss behavior removed from the situation.  It may be helpful to videotape Ms. Kelner interacting socially and then review it together. This allows her to see herself from another's perspective. Discussion of the  videotape with someone, such as a Social worker or therapist, will be important. This method should be considered carefully and approached collaboratively with her. Although videotaping can be a powerful tool, there also is potential for emotional consequences and negative effects on self-esteem. A social skills group may be a helpful venue to increase Ms. Bowlds's awareness of the impact her behavior has on others. This can provide not only direct skill training but also an opportunity for helpful feedback from a counselor or peers in a safe setting. Some individuals like Ms. Morsey may benefit from receiving reading material related to the nature and causes of their cognitive and behavioral problems. The provision of articles or "fact sheets" can help improve awareness, increase responsiveness to more direct interventions, and facilitate understanding between individuals with cognitive deficits and their caregivers, family members, and supervisors.           Madison Kim, [place]  [address]  Phone: [phone]  Fax: [phone]   Positive Behavior Support for People with  Developmental Disorders  Make a  Schedule - Example  Time/Order Activity Picture Comment Completed  7:00am Get Ready for School - Dress, Eat, Brush Teeth  Clothes Put Homework in [place] to [employer] bus Raise your hand before you speak   3:30pm Return from School and rest Bed or couch Quiet Activities only   4:00pm Start Homework Books Check spelling words   5:30pm Prepare for Dinner Table Set Table  Wash Hands   7:00pm Play/TV Time Toys TV Clean up toys when finished   8:00 Get Ready for Bed -  Pajamas, Brush Teeth, Story Bed In Bed by 8:30    Routines - A set of activities done the same way each time.  Examples Morning Routine -  Wake up  Go to bathroom  Get dressed  Eat breakfast  Brush teeth  Put on shoes.              Bedtime Routine - Get undressed Put on pajamas Brush teeth Relaxation activity (story or soft music) Get in bed  Cleaning Room Routine - Put toys in toy box Put books in bookshelf Put dirty clothes in hamper Put clean clothes in drawer Make Bed  Homework Routine -  Find quiet area with desk or table Get all needed books and papers Take out one assignment at a time Take a 5 minute break after each assignment Put completed assignments back in folder Put folder in backpack when all assignments completed  A time limit can be used for breaks instead of assignment completion.  A timer can be used. Place more enjoyable activities after the less preferred activities to reinforce participation. Smaller routines can sometimes be combined to form larger routines.   Task Analysis - Breaking activities down into a series of small steps for the person to handle.  Cleaning Room Put toys in toy box Put dirty clothes in hamper Put books on bookshelf Make bed (Making bed can also be broken down into smaller steps)  Brushing Teeth Run water Place toothbrush under water Place toothbrush on sink Turn off water Remove toothpaste cap Squeeze toothpaste onto  toothbrush Brush teeth Rinse toothbrush Fill cup with water Rinse mouth  Steps can be combined or added as needed depending upon functioning level.  Shaping - If a task or activity is too difficult and cannot be broken down any further, have the person do gradually closer approximations  to the desired behavior until you get the response that you want. Example -  Sleeping independently Sleep with other person next to bed Sleep with other person in room by door Sleep with other person just outside of door Sleep with other person in next room Sleep without other person  Communicating desire for an object Person leads you to object Person points to object Person points to picture of object Person gives picture of object to you Person vocalizes (any kind of vocalization) while giving picture to you Person makes vocalization that sounds like the correct word Person says the correct word    Scaffolding Gradually expand the person's experiences.  For example, teach new behaviors (one at a time) within the context of a familiar location, routine, and person.  When the person has practiced and is comfortable exhibiting the new behavior in the familiar setting, then have the practice the behavior in a slightly new setting by changing either the location, routine, or person.  Later another aspect of the environment can be changed until the person is able to demonstrate the behavior comfortably in multiple settings, with multiple people, and multiple circumstances.           Visual Prompts Picture Books - Place pictures of common objects, places, people, toys, activities, etc. in a book.  The person can point to the pictures of what they want or they can take the pictures out of the book and hand them to you.  Consult with a Speech/Language pathologist regarding the most appropriate form of communication for that person.  Picture Schedules - Attach pictures to your schedules and routine lists to  increase understanding.  Ideas for Creating Pictures:             Website: [email or web address]             Software: Transport planner: Take pictures of common objects, places etc.   Sensory Regulation Avoid place of excess stimulation such as crowded department stores or restaurants.  Go to smaller places or during off peak hours.  If you have to go to a place that is highly stimulating, go for a short time or find a quiet place for the person to go for frequent breaks.  Ways to decrease excess stimulation: Quiet activities or soft music Firm touch such as deep massage or heavy blankets/vests Deep rhythmic breathing Separation from others Ways to increase stimulation - When a person is under stimulated you may notice more odd and repetitive behaviors.  Getting the person involved in a meaningful activity can reduce the occurrence.  Loud noise or music    Light touch Short rapid breaths Spicy foods Other activities such as exercise, art, scents, and swinging can be either stimulating or calming depending upon the person.  Check with an Occupational Therapist about specific sensory activities.   Intervention for when Person Loses Control Caregiver stays calm Person goes to quiet area or other people leave the area so it becomes quiet. Person is left alone to calm self with only monitoring from the caregiver. There should not be any intervention until the person is calm. Once the person is calm, they can be redirected to another activity, given an alternative behavior perform instead of becoming upset, or have their options explained to them so they can make an appropriate choice. The person can be taught to take 10 deep breaths to assist in  calming. The teaching should be done during times when the person is calm.  They can be reminded one time to use the breathing while upset. Physical restraint should only be used if the person is hurting himself or  others. For prolonged behavioral outbursts, caregivers should switch monitoring the person every 15 minutes if possible so the care givers can remain calm themselves.  Transition - Steps to help with going from one activity to another: Set a specific time for when the activity will change and inform the person ahead of time.  Make sure they know what the new activity will be and detail any actions they need to do in between such as cleaning.  Use a timer or some other way of letting the person know when the current activity is finished. Give the person a brief warning about 2-3 minutes before the activity is complete so they can mentally prepare for the change. When going to a new place or activity, bringing a familiar object may help ease anxiety.    Reviewing a picture or other schedule with the person prior to the activities can help can give the person advanced warning of changes. Social Stories (brief stories about social situations) can be written with the person to help them understand the concept of changes and about going from one activity to another.   Alternatives - Always give an alternative when the person is not able to get what they want. When a request is denied tell the person what they can have instead. When something is taken away, replace it with something else. If what the person wants is not available, let them know specifically when it will be available.  Use the schedule to show people when they can have what they want.  Reinforcing Positive Behaviors - Let the person know when they have behaved appropriately. Be specific about what they had done and how it was helpful.  E.g. "when you shared your toy with your sister it made her happy." Be careful about using excessive excitement, praise, or touch (E.g. pats on the back).  Many people with Autism are sensitive to these and may view this as aversive. Stay calm and show positive emotion when giving feedback.  Correcting  Inappropriate Behaviors - Let the person know when they have behaved inappropriately and show them a more appropriate behavior.     Wait until the person is calm before applying any correction. The new behavior should help the person achieve the same outcome as the inappropriate behavior, but in a different way. The new behavior should be something the person can do.   Break the action into small steps whenever teaching a new behavior. Help the person practice the new behavior so it can eventually replace the old behavior.      Providing Consequences Consequences for appropriate and inappropriate behavior can be given under the following circumstances: Make sure the person knows and understands the consequences ahead of time.  Use pictures to demonstrate the consequences if needed.   Have the consequences be consistent with the behavior being exhibited.  Example: person hits sibling. Right Way - person apologizes, uses words or gentle touch, and performs a positive activity for sibling. Wrong Way - person is sent to their room or has toys taken away.   Always follow through on the consequences once they are stated. Provide a balance of positive and negative consequences so they person maintains their self-esteem.  Look for partial elements of positive behaviors if needed.  Revonda Standard Keyvin Rison, Ph.D. Licensed Clinical Psychologist - [facility] [phone] Email: [email or web address]                Rainey Pines, PhD

## 2023-01-18 ENCOUNTER — Other Ambulatory Visit: Payer: Self-pay | Admitting: Nurse Practitioner

## 2023-01-19 ENCOUNTER — Other Ambulatory Visit: Payer: Self-pay | Admitting: Nurse Practitioner

## 2023-01-19 ENCOUNTER — Other Ambulatory Visit (HOSPITAL_COMMUNITY): Payer: Self-pay

## 2023-01-19 DIAGNOSIS — Z6832 Body mass index (BMI) 32.0-32.9, adult: Secondary | ICD-10-CM

## 2023-01-19 MED ORDER — ZEPBOUND 5 MG/0.5ML ~~LOC~~ SOAJ
5.0000 mg | SUBCUTANEOUS | 0 refills | Status: DC
  Filled 2023-01-19 – 2023-01-24 (×2): qty 2, 28d supply, fill #0

## 2023-01-24 ENCOUNTER — Other Ambulatory Visit (HOSPITAL_COMMUNITY): Payer: Self-pay

## 2023-01-24 ENCOUNTER — Other Ambulatory Visit: Payer: Self-pay

## 2023-02-24 ENCOUNTER — Other Ambulatory Visit: Payer: Self-pay | Admitting: Nurse Practitioner

## 2023-02-24 DIAGNOSIS — Z6832 Body mass index (BMI) 32.0-32.9, adult: Secondary | ICD-10-CM

## 2023-02-25 ENCOUNTER — Other Ambulatory Visit (HOSPITAL_COMMUNITY): Payer: Self-pay

## 2023-02-25 ENCOUNTER — Other Ambulatory Visit: Payer: Self-pay | Admitting: Nurse Practitioner

## 2023-02-25 ENCOUNTER — Encounter: Payer: Self-pay | Admitting: Nurse Practitioner

## 2023-02-25 ENCOUNTER — Telehealth: Payer: Self-pay

## 2023-02-25 DIAGNOSIS — E6609 Other obesity due to excess calories: Secondary | ICD-10-CM

## 2023-02-25 MED ORDER — ZEPBOUND 7.5 MG/0.5ML ~~LOC~~ SOAJ
7.5000 mg | SUBCUTANEOUS | 0 refills | Status: DC
  Filled 2023-02-25: qty 2, 28d supply, fill #0

## 2023-02-25 MED ORDER — ZEPBOUND 5 MG/0.5ML ~~LOC~~ SOAJ
5.0000 mg | SUBCUTANEOUS | 0 refills | Status: DC
Start: 2023-02-25 — End: 2023-03-30
  Filled 2023-02-25 (×2): qty 2, 28d supply, fill #0

## 2023-02-25 NOTE — Telephone Encounter (Signed)
Pt called and the pharmacy and they do not have the 7.5   in stock and the pharmacist Aundra Millet said pt  is safe to go up to 10 mg which they have in stock. Today is the last day that [facility] is covering.

## 2023-02-25 NOTE — Telephone Encounter (Signed)
See patient phone call.

## 2023-02-25 NOTE — Telephone Encounter (Signed)
Tired to call and could not hear the pt. I did send a mychart message relaying matt message to pt.

## 2023-02-25 NOTE — Telephone Encounter (Signed)
Patient called in regarding this message .She stated that if the 10 is too much she can always slit it in half.

## 2023-02-25 NOTE — Telephone Encounter (Signed)
I am not going to double the medication. She can continue the 5mg  dose if she wants or can look to a different pharmacy

## 2023-02-25 NOTE — Telephone Encounter (Signed)
Pt is tolerating the medication ok, and is ready to go up in dose. I made a 3 week appointment for 03/18/2023.

## 2023-02-25 NOTE — Telephone Encounter (Signed)
Please see previous phone note.  

## 2023-03-08 ENCOUNTER — Other Ambulatory Visit: Payer: Self-pay

## 2023-03-08 ENCOUNTER — Other Ambulatory Visit (HOSPITAL_COMMUNITY): Payer: Self-pay

## 2023-03-18 ENCOUNTER — Encounter: Payer: Self-pay | Admitting: Nurse Practitioner

## 2023-03-18 ENCOUNTER — Other Ambulatory Visit (HOSPITAL_COMMUNITY): Payer: Self-pay

## 2023-03-18 ENCOUNTER — Ambulatory Visit: Payer: Commercial Managed Care - PPO | Admitting: Nurse Practitioner

## 2023-03-18 VITALS — BP 122/74 | HR 75 | Temp 98.0°F | Resp 16 | Ht 68.0 in | Wt 200.2 lb

## 2023-03-18 DIAGNOSIS — R051 Acute cough: Secondary | ICD-10-CM | POA: Diagnosis not present

## 2023-03-18 DIAGNOSIS — J0100 Acute maxillary sinusitis, unspecified: Secondary | ICD-10-CM | POA: Diagnosis not present

## 2023-03-18 DIAGNOSIS — L989 Disorder of the skin and subcutaneous tissue, unspecified: Secondary | ICD-10-CM

## 2023-03-18 DIAGNOSIS — E669 Obesity, unspecified: Secondary | ICD-10-CM | POA: Diagnosis not present

## 2023-03-18 MED ORDER — GUAIFENESIN-CODEINE 100-10 MG/5ML PO SOLN
5.0000 mL | Freq: Three times a day (TID) | ORAL | 0 refills | Status: DC | PRN
Start: 2023-03-18 — End: 2023-06-22
  Filled 2023-03-18: qty 120, 8d supply, fill #0

## 2023-03-18 MED ORDER — AMOXICILLIN-POT CLAVULANATE 875-125 MG PO TABS
1.0000 | ORAL_TABLET | Freq: Two times a day (BID) | ORAL | 0 refills | Status: AC
Start: 2023-03-18 — End: 2023-03-25
  Filled 2023-03-18: qty 14, 7d supply, fill #0

## 2023-03-18 NOTE — Patient Instructions (Signed)
Nice to see you today I have sent in antibiotics and cough medication The cough syrup can cause sedation use caution Let me know about the dermatologist  Use hydrocortisone OTC twice a day for no more than a week on the spot

## 2023-03-18 NOTE — Progress Notes (Signed)
Established Patient Office Visit  Subjective   Patient ID: Angela Mccann, female    DOB: [birth year]  Age: 52 y.o. MRN: [id number]  Chief Complaint  Patient presents with   Medication Consultation    HPI  Weight loss:  patient was started on zepbound. States that she was 220 pounds in the beginning. States that the first few weeks were rough. She had to learn what to eat. States that she had spasms, burping and reflux. States that she is doing the 5mg  once a week States that she is doing waling and baseball with her son  Cough: States that she mowed the grass 2 weeks ago and had an allergy flare. States that on Tuesday it moved into her chest. States that she was able to work all weekend. States that she had some azithromycin 1000mg  dose. States that she had some 3 prednisone and her sons inhaler. States that the inhaler helped Covid test 3 times that were negative   States that she has been taking the mucinex DM.  Patient states that helped some just not strong enough    Review of Systems  Constitutional:  Positive for chills and fever.  HENT:  Positive for ear pain (full and pain). Negative for ear discharge and sore throat.   Respiratory:  Positive for cough, sputum production and shortness of breath (with coughing fits).   Musculoskeletal:  Positive for joint pain and myalgias.  Neurological:  Negative for headaches.      Objective:     BP 122/74   Pulse 75   Temp 98 F (36.7 C)   Resp 16   Ht 5\' 8"  (1.727 m)   Wt 200 lb 4 oz (90.8 kg)   SpO2 99%   BMI 30.45 kg/m  BP Readings from Last 3 Encounters:  03/18/23 122/74  05/28/22 110/76  10/11/21 107/63   Wt Readings from Last 3 Encounters:  03/18/23 200 lb 4 oz (90.8 kg)  05/28/22 212 lb 2 oz (96.2 kg)  12/17/14 165 lb (74.8 kg)      Physical Exam Vitals and nursing note reviewed.  Constitutional:      Appearance: Normal appearance.  HENT:     Right Ear: Tympanic membrane, ear canal and external ear  normal.     Left Ear: Tympanic membrane, ear canal and external ear normal.     Mouth/Throat:     Mouth: Mucous membranes are moist.     Pharynx: Oropharynx is clear.  Cardiovascular:     Rate and Rhythm: Normal rate and regular rhythm.     Heart sounds: Normal heart sounds.  Pulmonary:     Effort: Pulmonary effort is normal.     Breath sounds: Normal breath sounds.  Lymphadenopathy:     Cervical: No cervical adenopathy.  Skin:    Findings: Rash present.       Neurological:     Mental Status: She is alert.      No results found for any visits on 03/18/23.    The 10-year ASCVD risk score (Arnett DK, et al., 2019) is: 1.3%    Assessment & Plan:   Problem List Items Addressed This Visit       Respiratory   Acute non-recurrent maxillary sinusitis - Primary    Signs and symptoms of same consistent with sinusitis treat with Augmentin 875-125 mg twice daily for 7 days.  Patient also use Flonase states she has some at home and does not need a prescription  Relevant Medications   amoxicillin-clavulanate (AUGMENTIN) 875-125 MG tablet   guaiFENesin-codeine 100-10 MG/5ML syrup     Musculoskeletal and Integument   Skin lesion    Skin lesion to the upper left cheek.  Does not seem fungal in nature does not itch patient can use hydrocortisone 1% twice daily for no longer than a week to the area if no improvement she will reach out to me and we will refer her to dermatology.        Other   Acute cough    Codeine-guaifenesin cough medication Tylenol 3 times daily as needed sedation precautions reviewed patient did at home COVID test 3 times that was negative      Relevant Medications   guaiFENesin-codeine 100-10 MG/5ML syrup   Obesity (BMI 30-39.9)    Patient currently maintained on stepdown 5 mg weekly.  Tolerating medication well.  Has had an appreciable 20 pound weight loss.  Continue medication as prescribed titrate up she will follow-up in 3 months for CPE and labs        Return in about 3 months (around 06/18/2023) for CPE and Labs, weight recheck .    Audria Nine, NP

## 2023-03-18 NOTE — Assessment & Plan Note (Signed)
Signs and symptoms of same consistent with sinusitis treat with Augmentin 875-125 mg twice daily for 7 days.  Patient also use Flonase states she has some at home and does not need a prescription

## 2023-03-18 NOTE — Assessment & Plan Note (Signed)
Codeine-guaifenesin cough medication Tylenol 3 times daily as needed sedation precautions reviewed patient did at home COVID test 3 times that was negative

## 2023-03-18 NOTE — Assessment & Plan Note (Signed)
Patient currently maintained on stepdown 5 mg weekly.  Tolerating medication well.  Has had an appreciable 20 pound weight loss.  Continue medication as prescribed titrate up she will follow-up in 3 months for CPE and labs

## 2023-03-18 NOTE — Assessment & Plan Note (Signed)
Skin lesion to the upper left cheek.  Does not seem fungal in nature does not itch patient can use hydrocortisone 1% twice daily for no longer than a week to the area if no improvement she will reach out to me and we will refer her to dermatology.

## 2023-03-30 ENCOUNTER — Other Ambulatory Visit: Payer: Self-pay | Admitting: Nurse Practitioner

## 2023-03-30 ENCOUNTER — Other Ambulatory Visit (HOSPITAL_COMMUNITY): Payer: Self-pay

## 2023-03-30 ENCOUNTER — Encounter: Payer: Self-pay | Admitting: Nurse Practitioner

## 2023-03-30 DIAGNOSIS — E669 Obesity, unspecified: Secondary | ICD-10-CM

## 2023-03-30 MED ORDER — ZEPBOUND 7.5 MG/0.5ML ~~LOC~~ SOAJ
7.5000 mg | SUBCUTANEOUS | 0 refills | Status: DC
Start: 2023-03-30 — End: 2023-05-10
  Filled 2023-03-30 – 2023-03-31 (×2): qty 2, 28d supply, fill #0

## 2023-03-31 ENCOUNTER — Other Ambulatory Visit (HOSPITAL_COMMUNITY): Payer: Self-pay

## 2023-03-31 ENCOUNTER — Other Ambulatory Visit: Payer: Self-pay

## 2023-03-31 MED ORDER — LORAZEPAM 0.5 MG PO TABS
0.5000 mg | ORAL_TABLET | ORAL | 0 refills | Status: AC | PRN
  Filled 2023-03-31: qty 30, 30d supply, fill #0

## 2023-04-01 ENCOUNTER — Other Ambulatory Visit (HOSPITAL_COMMUNITY): Payer: Self-pay

## 2023-05-09 ENCOUNTER — Other Ambulatory Visit: Payer: Self-pay | Admitting: Nurse Practitioner

## 2023-05-09 ENCOUNTER — Encounter: Payer: Self-pay | Admitting: Nurse Practitioner

## 2023-05-09 DIAGNOSIS — E669 Obesity, unspecified: Secondary | ICD-10-CM

## 2023-05-09 NOTE — Telephone Encounter (Signed)
Patient is also requesting to change ZEPBOUND dosage from 7.5 to 10 mL.   LAST APPOINTMENT DATE: 03/30/2023 (acute)    NEXT APPOINTMENT DATE: Next visit not found.    ZEPBOUND LAST REFILL: 03/30/2023   QTY:  2mL 0RF

## 2023-05-09 NOTE — Telephone Encounter (Signed)
Can we see if the patient is wanting to go to the next dose of 10mg  please

## 2023-05-10 ENCOUNTER — Other Ambulatory Visit (HOSPITAL_COMMUNITY): Payer: Self-pay

## 2023-05-10 MED ORDER — ZEPBOUND 10 MG/0.5ML ~~LOC~~ SOAJ
10.0000 mg | SUBCUTANEOUS | 0 refills | Status: DC
Start: 2023-05-10 — End: 2023-07-07
  Filled 2023-05-10: qty 2, 28d supply, fill #0

## 2023-05-11 ENCOUNTER — Other Ambulatory Visit: Payer: Self-pay

## 2023-05-11 ENCOUNTER — Other Ambulatory Visit (HOSPITAL_COMMUNITY): Payer: Self-pay

## 2023-05-26 DIAGNOSIS — Z01419 Encounter for gynecological examination (general) (routine) without abnormal findings: Secondary | ICD-10-CM | POA: Diagnosis not present

## 2023-05-26 DIAGNOSIS — Z1231 Encounter for screening mammogram for malignant neoplasm of breast: Secondary | ICD-10-CM | POA: Diagnosis not present

## 2023-05-26 DIAGNOSIS — Z6829 Body mass index (BMI) 29.0-29.9, adult: Secondary | ICD-10-CM | POA: Diagnosis not present

## 2023-06-01 ENCOUNTER — Encounter (INDEPENDENT_AMBULATORY_CARE_PROVIDER_SITE_OTHER): Payer: Self-pay

## 2023-06-22 ENCOUNTER — Ambulatory Visit (INDEPENDENT_AMBULATORY_CARE_PROVIDER_SITE_OTHER): Payer: Commercial Managed Care - PPO | Admitting: Nurse Practitioner

## 2023-06-22 ENCOUNTER — Encounter: Payer: Self-pay | Admitting: Nurse Practitioner

## 2023-06-22 ENCOUNTER — Other Ambulatory Visit (HOSPITAL_COMMUNITY): Payer: Self-pay

## 2023-06-22 ENCOUNTER — Other Ambulatory Visit: Payer: Self-pay

## 2023-06-22 VITALS — BP 102/80 | HR 79 | Temp 97.9°F | Ht 67.05 in | Wt 184.6 lb

## 2023-06-22 DIAGNOSIS — G4726 Circadian rhythm sleep disorder, shift work type: Secondary | ICD-10-CM | POA: Diagnosis not present

## 2023-06-22 DIAGNOSIS — Z1211 Encounter for screening for malignant neoplasm of colon: Secondary | ICD-10-CM | POA: Diagnosis not present

## 2023-06-22 DIAGNOSIS — Z114 Encounter for screening for human immunodeficiency virus [HIV]: Secondary | ICD-10-CM | POA: Diagnosis not present

## 2023-06-22 DIAGNOSIS — E663 Overweight: Secondary | ICD-10-CM | POA: Diagnosis not present

## 2023-06-22 DIAGNOSIS — Z8742 Personal history of other diseases of the female genital tract: Secondary | ICD-10-CM | POA: Diagnosis not present

## 2023-06-22 DIAGNOSIS — E7800 Pure hypercholesterolemia, unspecified: Secondary | ICD-10-CM | POA: Insufficient documentation

## 2023-06-22 DIAGNOSIS — Z0000 Encounter for general adult medical examination without abnormal findings: Secondary | ICD-10-CM

## 2023-06-22 DIAGNOSIS — Z1159 Encounter for screening for other viral diseases: Secondary | ICD-10-CM

## 2023-06-22 DIAGNOSIS — G43009 Migraine without aura, not intractable, without status migrainosus: Secondary | ICD-10-CM | POA: Diagnosis not present

## 2023-06-22 DIAGNOSIS — E559 Vitamin D deficiency, unspecified: Secondary | ICD-10-CM | POA: Diagnosis not present

## 2023-06-22 LAB — CBC
HCT: 41.6 % (ref 36.0–46.0)
Hemoglobin: 13.8 g/dL (ref 12.0–15.0)
MCHC: 33.2 g/dL (ref 30.0–36.0)
MCV: 87.6 fl (ref 78.0–100.0)
Platelets: 289 10*3/uL (ref 150.0–400.0)
RBC: 4.75 Mil/uL (ref 3.87–5.11)
RDW: 13.6 % (ref 11.5–15.5)
WBC: 6 10*3/uL (ref 4.0–10.5)

## 2023-06-22 LAB — LIPID PANEL
Cholesterol: 197 mg/dL (ref 0–200)
HDL: 40.6 mg/dL (ref >39.00)
LDL Cholesterol: 133 mg/dL — ABNORMAL HIGH (ref 0–99)
NonHDL: 156.41
Total CHOL/HDL Ratio: 5
Triglycerides: 118 mg/dL (ref 0.0–149.0)
VLDL: 23.6 mg/dL (ref 0.0–40.0)

## 2023-06-22 LAB — COMPREHENSIVE METABOLIC PANEL
ALT: 23 U/L (ref 0–35)
AST: 21 U/L (ref 0–37)
Albumin: 4.6 g/dL (ref 3.5–5.2)
Alkaline Phosphatase: 78 U/L (ref 39–117)
BUN: 13 mg/dL (ref 6–23)
CO2: 29 meq/L (ref 19–32)
Calcium: 9.7 mg/dL (ref 8.4–10.5)
Chloride: 103 meq/L (ref 96–112)
Creatinine, Ser: 0.89 mg/dL (ref 0.40–1.20)
GFR: 75.33 mL/min (ref >60.00)
Glucose, Bld: 99 mg/dL (ref 70–99)
Potassium: 4.1 meq/L (ref 3.5–5.1)
Sodium: 139 meq/L (ref 135–145)
Total Bilirubin: 0.5 mg/dL (ref 0.2–1.2)
Total Protein: 7.2 g/dL (ref 6.0–8.3)

## 2023-06-22 MED ORDER — ARMODAFINIL 150 MG PO TABS
150.0000 mg | ORAL_TABLET | Freq: Every day | ORAL | 5 refills | Status: DC
  Filled 2023-06-22: qty 30, 30d supply, fill #0
  Filled 2023-09-01: qty 30, 30d supply, fill #1
  Filled 2023-10-26: qty 30, 30d supply, fill #2

## 2023-06-22 NOTE — Assessment & Plan Note (Signed)
Elevated LDL last year patient has lost weight using trans appetite pending lipid panel today continue work on healthy lifestyle modifications.

## 2023-06-22 NOTE — Progress Notes (Signed)
Established Patient Office Visit  Subjective   Patient ID: Angela Mccann, female    DOB: [birth year]  Age: 52 y.o. MRN: [id number]  Chief Complaint  Patient presents with   Annual Exam    Sees Dr. Rana Snare for GYN. Received Tdap for job. Declines shingles vaccine.     HPI  for complete physical and follow up of chronic conditions.   Migraines: generally once a month. She does hav etriggers, certain lighting, sounds, and smells. Generally left sided and behind the eye. Described as a stabbing pain. 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  Patient is requesting starting this medication back.  Obesity: Patient currently maintained on tirzepatide 10 mg injection.  Patient currently taking injections at 1 week and tolerating it recently.  States she does have some nausea several days after the injection but then leveled out thereafter.  No vomiting.  Patient has had great weight loss with the use of the medication   Immunizations: -Tetanus: Completed in utd -Influenza: Completed this season -Shingles: refused -Pneumonia: Too young -COVID: Up-to-date  Diet: Fair diet. States that a few days after the inejction less meals. Doing protein shakes and liquid iv daily. 2 meals and  very infrequent snacks  Exercise: No regular exercise. States that she is doing pilates once a week  Eye exam: PRN Dental exam: Completes semi-annually    Colonoscopy: Completed in 2014 overdue for screening colonoscopy ambulatory referral placed for [facility] Lung Cancer Screening: N/A  Pap smear: last one July 2024 that was normal.  She is followed by [facility] [place]  Mammogram: 2024 normal in office with Dr. [facility]ice [facility] [place]  Sleep: states that hse will  get approx 5 hours of sleep. States that on her on days it is 4 hours. Does not feel rested. Does not snore       Review of Systems  Constitutional:  Negative for chills and fever.  Respiratory:  Negative for shortness of breath.   Cardiovascular:  Negative for chest pain and leg swelling.  Gastrointestinal:  Negative for abdominal pain, blood in stool, constipation, diarrhea, nausea and vomiting.       BM daily   Genitourinary:  Negative for dysuria and hematuria.  Neurological:  Negative for tingling and headaches.  Psychiatric/Behavioral:  Negative for hallucinations and suicidal ideas.       Objective:     BP 102/80   Pulse 79   Temp 97.9 F (36.6 C) (Temporal)   Ht 5' 7.05" (1.703 m)   Wt 184 lb 9.6 oz (83.7 kg)   SpO2 97%   BMI 28.87 kg/m  BP Readings from Last 3 Encounters:  06/22/23 102/80  03/18/23 122/74  05/28/22 110/76   Wt Readings from Last 3 Encounters:  06/22/23 184 lb 9.6 oz (83.7 kg)  03/18/23 200 lb 4 oz (90.8 kg)  05/28/22 212 lb 2 oz (96.2 kg)      Physical Exam Vitals and nursing note reviewed.  Constitutional:      Appearance: Normal appearance.  HENT:     Right Ear: Tympanic membrane, ear canal and external ear normal.     Left Ear: Tympanic membrane, ear canal and external ear normal.     Mouth/Throat:  Mouth: Mucous membranes are moist.     Pharynx: Oropharynx is clear.  Eyes:     Extraocular Movements: Extraocular movements intact.     Pupils: Pupils are equal, round, and reactive to light.  Cardiovascular:     Rate and Rhythm: Normal rate and regular rhythm.     Pulses: Normal pulses.     Heart sounds: Normal heart sounds.  Pulmonary:     Effort: Pulmonary effort is normal.     Breath sounds: Normal breath sounds.  Abdominal:     General: Bowel sounds are normal. There is no distension.     Palpations: There is no mass.     Tenderness: There is no abdominal tenderness.     Hernia: No hernia is present.  Musculoskeletal:      Right lower leg: No edema.     Left lower leg: No edema.  Lymphadenopathy:     Cervical: No cervical adenopathy.  Skin:    General: Skin is warm.  Neurological:     General: No focal deficit present.     Mental Status: She is alert.     Deep Tendon Reflexes:     Reflex Scores:      Bicep reflexes are 2+ on the right side and 2+ on the left side.      Patellar reflexes are 2+ on the right side and 2+ on the left side.    Comments: Bilateral upper and lower extremity strength 5/5  Psychiatric:        Mood and Affect: Mood normal.        Behavior: Behavior normal.        Thought Content: Thought content normal.        Judgment: Judgment normal.      No results found for any visits on 06/22/23.     The 10-year ASCVD risk score (Arnett DK, et al., 2019) is: 0.9%    Assessment & Plan:   Problem List Items Addressed This Visit       Cardiovascular and Mediastinum   Migraine    History of same approximately once a month.  Patient has tried and failed triptans does do well with Fioricet.  Continue medication as prescribed        Other   History of abnormal cervical Papanicolaou smear    [facility] this year per patient report that was normal.  Continue following with GYN as recommended      Vitamin D deficiency    History of the same patient taking vitamin D when she thinks about it.  Check vitamin D level today      Relevant Orders   VITAMIN D 25 Hydroxy (Vit-D Deficiency, Fractures)   Preventative health care - Primary    Discussed age-appropriate immunizations and screening exams.  Did review patient's personal, surgical, social, family histories.  Patient up-to-date on all age-appropriate vaccinations she would like.  Did encourage patient to get the shingles vaccine she deferred at this juncture.  Ambulatory referral to gastro for CRC screening.  Patient up-to-date on cervical cancer screening and breast  cancer screening through [facility]  Patient was given information at discharge about preventative healthcare maintenance with anticipatory guidance.      Relevant Orders   CBC   Comprehensive metabolic panel   Lipid panel   TSH   Shift work sleep disorder    History of same.  Patient is been on night shift for approximately 20 years.  She has been on the visual in the past and done well.  Will restart Nuvigil 150 mg 1 hour prior to work      Relevant Medications   Armodafinil 150 MG tablet   Overweight    Has had appreciable weight loss on his appetite 10 mg every other week.  Continue patient has went from obese to overweight category      Relevant Orders   Lipid panel   Pure hypercholesterolemia    Elevated LDL last year patient has lost weight using trans appetite pending lipid panel today continue work on healthy lifestyle modifications.      Relevant Orders   Lipid panel   Other Visit Diagnoses     Encounter for screening for HIV       Relevant Orders   HIV antibody (with reflex)   Encounter for hepatitis C screening test for low risk patient       Relevant Orders   Hepatitis C Antibody   Screening for colon cancer       Relevant Orders   Ambulatory referral to Gastroenterology       Return in about 6 months (around 12/23/2023) for weight and medication recheck .    Audria Nine, NP

## 2023-06-22 NOTE — Assessment & Plan Note (Signed)
Discussed age-appropriate immunizations and screening exams.  Did review patient's personal, surgical, social, family histories.  Patient up-to-date on all age-appropriate vaccinations she would like.  Did encourage patient to get the shingles vaccine she deferred at this juncture.  Ambulatory referral to gastro for CRC screening.  Patient up-to-date on cervical cancer screening and breast cancer screening through her GYN [facility].  Patient was given information at discharge about preventative healthcare maintenance with anticipatory guidance.

## 2023-06-22 NOTE — Assessment & Plan Note (Signed)
History of same approximately once a month.  Patient has tried and failed triptans does do well with Fioricet.  Continue medication as prescribed

## 2023-06-22 NOTE — Assessment & Plan Note (Signed)
Patient followed by [facility] at [facility] Pap smear this year per patient report that was normal.  Continue following with GYN as recommended

## 2023-06-22 NOTE — Assessment & Plan Note (Signed)
History of the same patient taking vitamin D when she thinks about it.  Check vitamin D level today

## 2023-06-22 NOTE — Assessment & Plan Note (Signed)
Has had appreciable weight loss on his appetite 10 mg every other week.  Continue patient has went from obese to overweight category

## 2023-06-22 NOTE — Patient Instructions (Signed)
Nice to see you today Follow up with me in 6 months sooner if you need me I will be in touch with the labs once I have reviewed them

## 2023-06-22 NOTE — Assessment & Plan Note (Signed)
History of same.  Patient is been on night shift for approximately 20 years.  She has been on the visual in the past and done well.  Will restart Nuvigil 150 mg 1 hour prior to work

## 2023-06-23 LAB — HEPATITIS C ANTIBODY: Hepatitis C Ab: NONREACTIVE

## 2023-06-23 LAB — HIV ANTIBODY (ROUTINE TESTING W REFLEX): HIV 1&2 Ab, 4th Generation: NONREACTIVE

## 2023-06-24 ENCOUNTER — Other Ambulatory Visit (HOSPITAL_COMMUNITY): Payer: Self-pay

## 2023-06-24 LAB — TSH: TSH: 1.38 u[IU]/mL (ref 0.35–5.50)

## 2023-06-24 LAB — VITAMIN D 25 HYDROXY (VIT D DEFICIENCY, FRACTURES): VITD: 23.33 ng/mL — ABNORMAL LOW (ref 30.00–100.00)

## 2023-06-27 ENCOUNTER — Other Ambulatory Visit (HOSPITAL_COMMUNITY): Payer: Self-pay

## 2023-06-27 ENCOUNTER — Other Ambulatory Visit: Payer: Self-pay | Admitting: Nurse Practitioner

## 2023-06-27 DIAGNOSIS — E559 Vitamin D deficiency, unspecified: Secondary | ICD-10-CM

## 2023-06-27 MED ORDER — VITAMIN D (ERGOCALCIFEROL) 1.25 MG (50000 UNIT) PO CAPS
50000.0000 [IU] | ORAL_CAPSULE | ORAL | 0 refills | Status: AC
  Filled 2023-06-27: qty 5, 35d supply, fill #0

## 2023-07-06 ENCOUNTER — Other Ambulatory Visit: Payer: Self-pay | Admitting: Nurse Practitioner

## 2023-07-06 DIAGNOSIS — E669 Obesity, unspecified: Secondary | ICD-10-CM

## 2023-07-07 ENCOUNTER — Encounter: Payer: Self-pay | Admitting: Nurse Practitioner

## 2023-07-07 ENCOUNTER — Other Ambulatory Visit (HOSPITAL_COMMUNITY): Payer: Self-pay

## 2023-07-07 ENCOUNTER — Encounter (HOSPITAL_COMMUNITY): Payer: Self-pay

## 2023-07-07 DIAGNOSIS — E669 Obesity, unspecified: Secondary | ICD-10-CM

## 2023-07-07 MED ORDER — ZEPBOUND 12.5 MG/0.5ML ~~LOC~~ SOAJ
12.5000 mg | SUBCUTANEOUS | 0 refills | Status: DC
Start: 2023-07-07 — End: 2023-12-26
  Filled 2023-07-07: qty 2, 28d supply, fill #0

## 2023-07-08 ENCOUNTER — Other Ambulatory Visit (HOSPITAL_COMMUNITY): Payer: Self-pay

## 2023-07-22 ENCOUNTER — Encounter: Payer: Self-pay | Admitting: Nurse Practitioner

## 2023-07-25 IMAGING — CT CT RENAL STONE PROTOCOL
2 of 4 series · 16 of 46 positions shown, 18 images · non-contrast
Comparison: CT dated December 17, 2014

CLINICAL DATA: Flank pain

EXAM:
CT ABDOMEN AND PELVIS WITHOUT CONTRAST
TECHNIQUE: Multidetector CT imaging of the abdomen and pelvis was performed
following the standard protocol without IV contrast.

[Series 3: renal stone 5.0 · axial · 0.70mm/px · z∈[-1051,-611]mm · 13 of 98 slices shown, 15 images]
[im 5/98  soft-tissue]
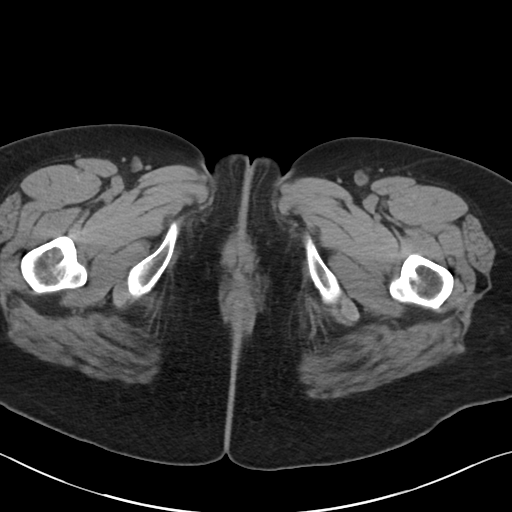
[im 5/98  bone]
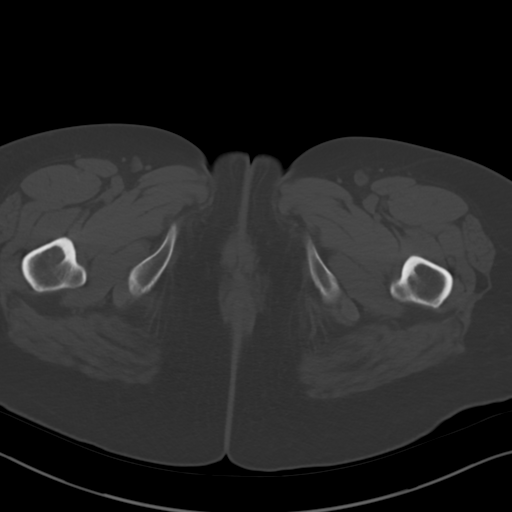
[im 13/98  soft-tissue]
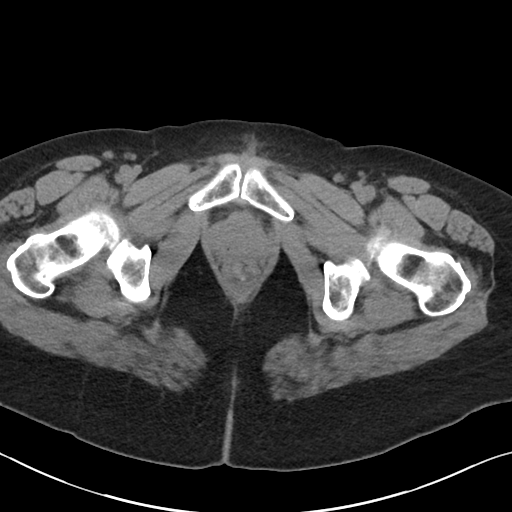
[im 21/98  soft-tissue]
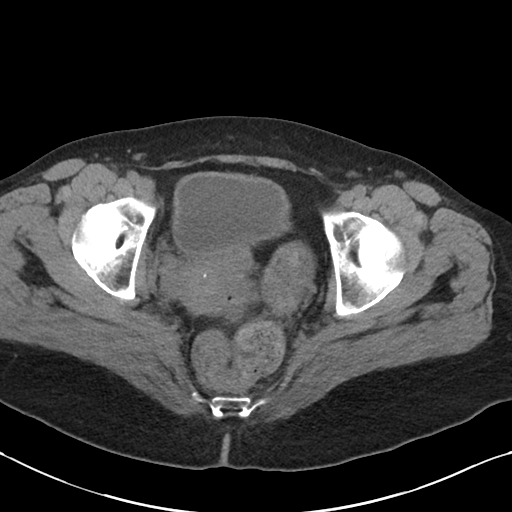
[im 29/98  soft-tissue]
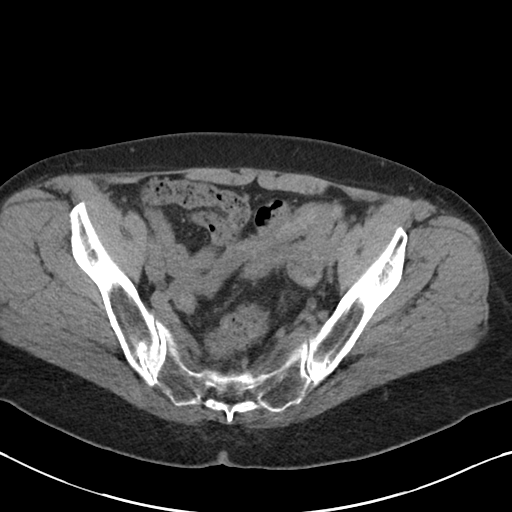
[im 33/98  soft-tissue]
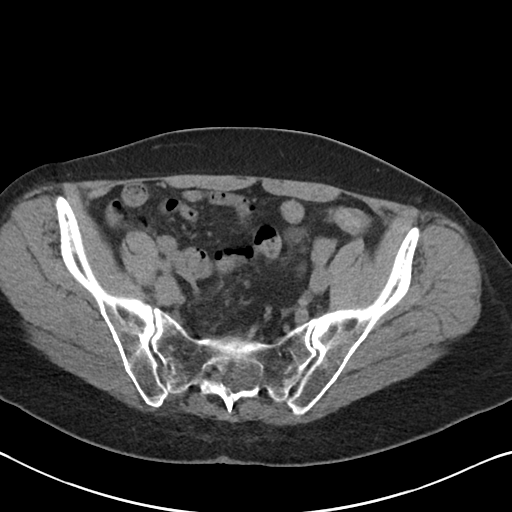
[im 41/98  soft-tissue]
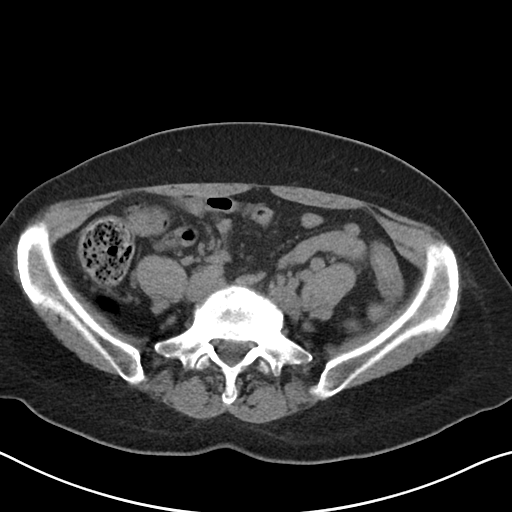
[im 49/98  soft-tissue]
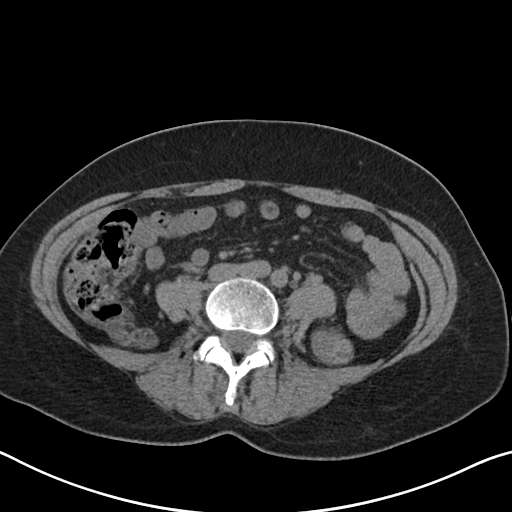
[im 57/98  soft-tissue]
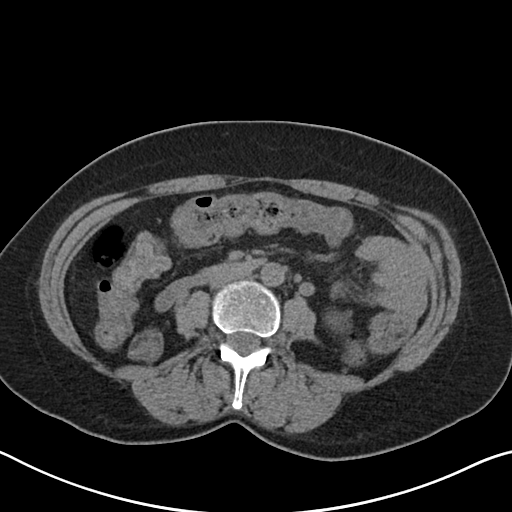
[im 65/98  soft-tissue]
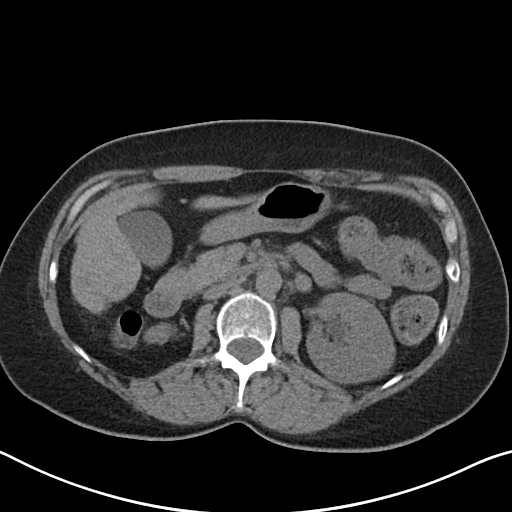
[im 65/98  bone]
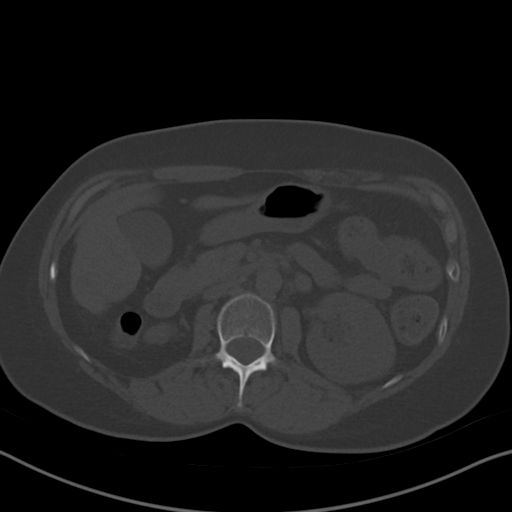
[im 69/98  soft-tissue]
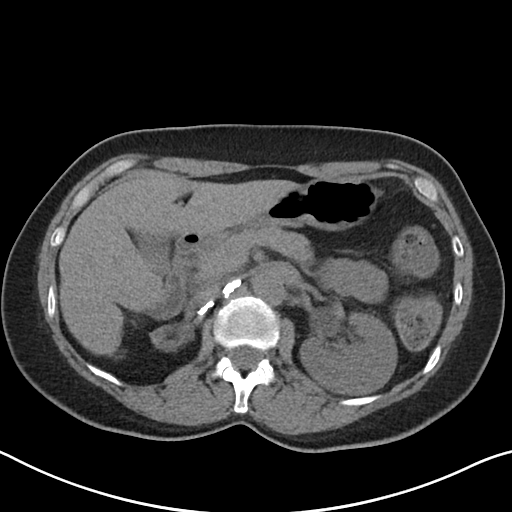
[im 77/98  soft-tissue]
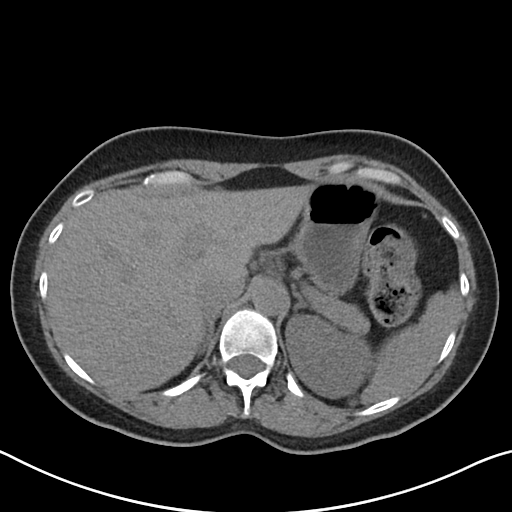
[im 85/98  soft-tissue]
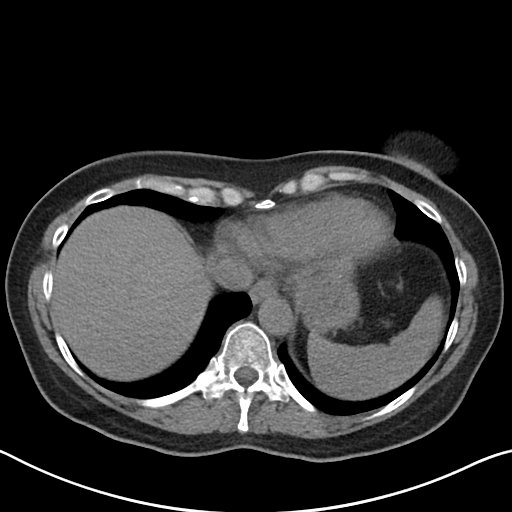
[im 93/98  soft-tissue]
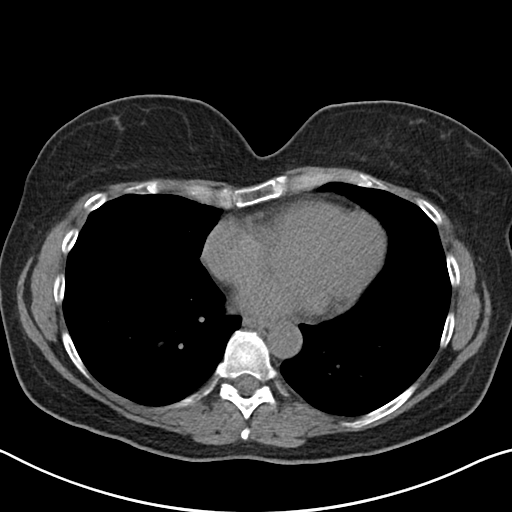

[Series 6: cor · coronal · 0.78mm/px · 3 of 132 slices shown]
[im 44/132  soft-tissue]
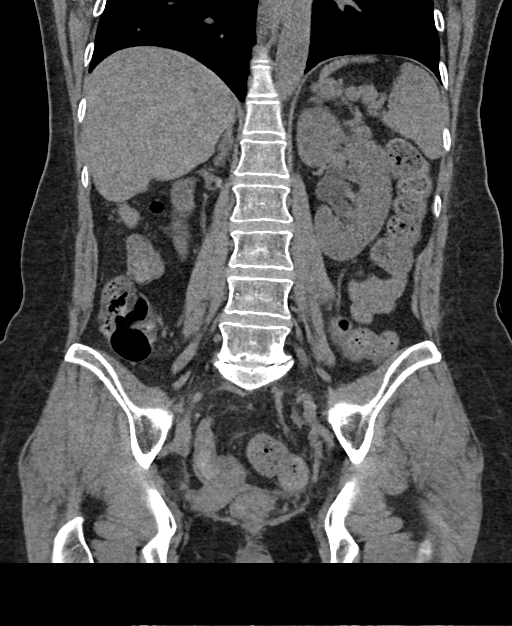
[im 59/132  soft-tissue]
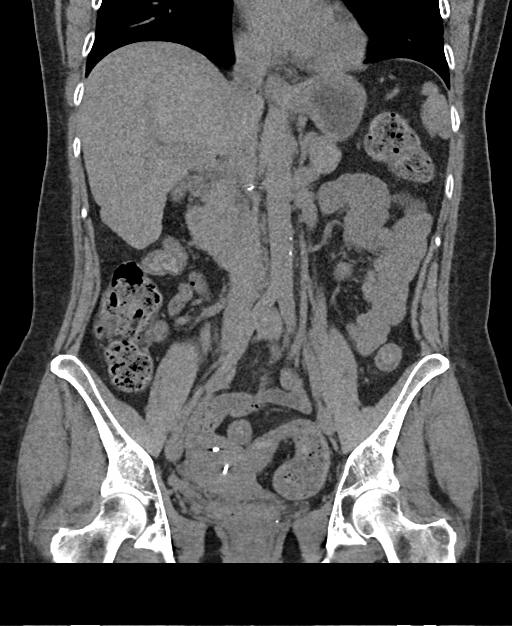
[im 73/132  soft-tissue]
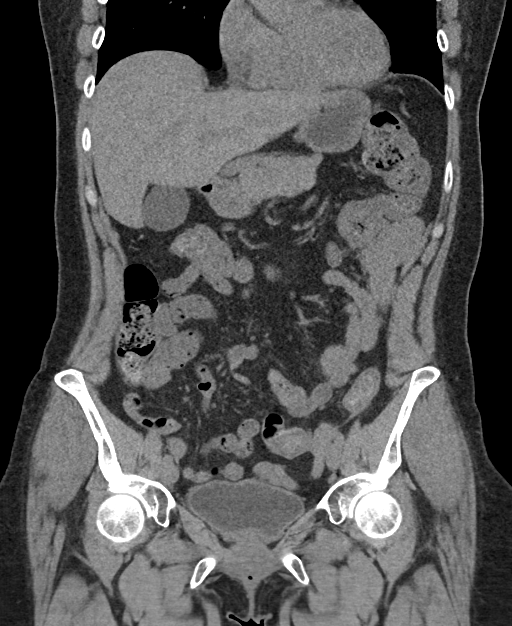

[16 of 46 positions shown; findings below may reference images not displayed]

FINDINGS: Lower chest: No acute abnormality.

Hepatobiliary: No focal liver abnormality is seen. No gallstones,
gallbladder wall thickening, or biliary dilatation.

Pancreas: Unremarkable. No pancreatic ductal dilatation or
surrounding inflammatory changes.

Spleen: Normal in size without focal abnormality.

Adrenals/Urinary Tract: Bilateral adrenal glands are unremarkable.
Atrophic right kidney. Compensatory hypertrophy of the left kidney
with mild hydronephrosis which is likely physiologic. Punctate
nonobstructing stone of the upper pole of the left kidney.

Stomach/Bowel: Stomach is within normal limits. Appendix appears
normal. No evidence of bowel wall thickening, distention, or
inflammatory changes.

Vascular/Lymphatic: Mild aortic atherosclerosis. No enlarged
abdominal or pelvic lymph nodes.

Reproductive: Uterus with IUD in expected position. No adnexal
masses.

Other: No abdominal wall hernia or abnormality. No abdominopelvic
ascites.

Musculoskeletal: No acute or significant osseous findings.
IMPRESSION: 1. No acute findings in the abdomen or pelvis, including no evidence
of obstructing stone.
2. Mild hydronephrosis of the left kidney, likely physiologic.
3. Punctate nonobstructing stone of the upper pole of the left
kidney.
4. Mild aortic Atherosclerosis ([id number]).

## 2023-11-03 DIAGNOSIS — H52223 Regular astigmatism, bilateral: Secondary | ICD-10-CM | POA: Diagnosis not present

## 2023-12-19 ENCOUNTER — Other Ambulatory Visit: Payer: Self-pay | Admitting: Nurse Practitioner

## 2023-12-19 DIAGNOSIS — E669 Obesity, unspecified: Secondary | ICD-10-CM

## 2023-12-19 NOTE — Telephone Encounter (Signed)
Can we make sure there has not been an interruption in therapy

## 2023-12-23 ENCOUNTER — Encounter: Payer: Self-pay | Admitting: Nurse Practitioner

## 2023-12-23 ENCOUNTER — Encounter: Payer: Self-pay | Admitting: Gastroenterology

## 2023-12-23 DIAGNOSIS — E669 Obesity, unspecified: Secondary | ICD-10-CM

## 2023-12-26 ENCOUNTER — Other Ambulatory Visit (HOSPITAL_COMMUNITY): Payer: Self-pay

## 2023-12-26 MED ORDER — ZEPBOUND 12.5 MG/0.5ML ~~LOC~~ SOAJ
12.5000 mg | SUBCUTANEOUS | 0 refills | Status: AC
  Filled 2023-12-26: qty 2, 28d supply, fill #0

## 2023-12-26 NOTE — Telephone Encounter (Signed)
 Called and spoke with patient she was previously using a compounded form of the Zepbound at 10mg . She noticed this was not longer helping so she is going back to brand name Zepbound. Her last dose of 10mg  was 02/14 give or take a day. Patient has f/u OV 02/27.

## 2023-12-28 ENCOUNTER — Other Ambulatory Visit (HOSPITAL_COMMUNITY): Payer: Self-pay

## 2023-12-28 ENCOUNTER — Ambulatory Visit: Payer: Commercial Managed Care - PPO | Admitting: Nurse Practitioner

## 2023-12-29 ENCOUNTER — Ambulatory Visit: Payer: Commercial Managed Care - PPO | Admitting: Nurse Practitioner

## 2023-12-29 VITALS — BP 114/82 | HR 74 | Temp 97.8°F | Ht 67.25 in | Wt 185.8 lb

## 2023-12-29 DIAGNOSIS — G4726 Circadian rhythm sleep disorder, shift work type: Secondary | ICD-10-CM | POA: Diagnosis not present

## 2023-12-29 DIAGNOSIS — E663 Overweight: Secondary | ICD-10-CM | POA: Diagnosis not present

## 2023-12-29 DIAGNOSIS — Z8639 Personal history of other endocrine, nutritional and metabolic disease: Secondary | ICD-10-CM

## 2023-12-29 NOTE — Progress Notes (Signed)
 Established Patient Office Visit  Subjective   Patient ID: Angela Mccann, female    DOB: [birth year]  Age: 53 y.o. MRN: [id number]  Chief Complaint  Patient presents with   Follow-up    Weight last visit was 184lbs. Weight today is 185.8. and medication recheck.      HPI  Obesity: patient was followed by me and we had gotten to zepbound 12.5mg . She transitioned to the compound version. She is here for a follow up. She requested a refill on the zepbound just recently. She is eating 1 good meal and some snacks. She may have a second meal and will drink water.  She is not doing formal exercsie but will walk the track while her son is at sport. She does mention that she did try compounded semaglutide.  States she had constipation and other adverse drug events.  States that she has taken the first dose of the 12.5 mg tirzepatide and tolerating it well currently.  Third shift workers disorder: hx of the same and has been on nuvigil in the past. Doing well and not falling asleep on the way home. States that she is using it as needed.     Review of Systems  Constitutional:  Negative for chills and fever.  Respiratory:  Negative for shortness of breath.   Cardiovascular:  Negative for chest pain.  Gastrointestinal:  Positive for constipation. Negative for abdominal pain, diarrhea, nausea and vomiting.       BM daily   Neurological:  Negative for headaches.      Objective:     BP 114/82   Pulse 74   Temp 97.8 F (36.6 C) (Oral)   Ht 5' 7.25" (1.708 m)   Wt 185 lb 12.8 oz (84.3 kg)   SpO2 99%   BMI 28.88 kg/m  BP Readings from Last 3 Encounters:  12/29/23 114/82  06/22/23 102/80  03/18/23 122/74   Wt Readings from Last 3 Encounters:  12/29/23 185 lb 12.8 oz (84.3 kg)  06/22/23 184 lb 9.6 oz (83.7 kg)  03/18/23 200 lb 4 oz (90.8 kg)   SpO2 Readings from Last 3 Encounters:  12/29/23 99%  06/22/23 97%  03/18/23 99%      Physical Exam Vitals and nursing note  reviewed.  Constitutional:      Appearance: Normal appearance.  Cardiovascular:     Rate and Rhythm: Normal rate and regular rhythm.     Heart sounds: Normal heart sounds.  Pulmonary:     Effort: Pulmonary effort is normal.     Breath sounds: Normal breath sounds.  Abdominal:     General: Bowel sounds are normal.  Neurological:     Mental Status: She is alert.      No results found for any visits on 12/29/23.    The 10-year ASCVD risk score (Arnett DK, et al., 2019) is: 1.5%    Assessment & Plan:   Problem List Items Addressed This Visit       Other   Shift work sleep disorder   Patient using arm operative 150 mg as needed.  Patient is getting good benefit has refills left at the pharmacy continue using as needed      Overweight   Continue tirzepatide 12.5 mg once weekly to every other week.  Continue working healthy lifestyle modifications      Other Visit Diagnoses       History of obesity    -  Primary       Return  in about 6 months (around 06/27/2024) for CPE and Labs.    Audria Nine, NP

## 2023-12-29 NOTE — Assessment & Plan Note (Signed)
 Continue tirzepatide 12.5 mg once weekly to every other week.  Continue working healthy lifestyle modifications

## 2023-12-29 NOTE — Assessment & Plan Note (Signed)
 Patient using arm operative 150 mg as needed.  Patient is getting good benefit has refills left at the pharmacy continue using as needed

## 2023-12-30 DIAGNOSIS — F419 Anxiety disorder, unspecified: Secondary | ICD-10-CM | POA: Insufficient documentation

## 2023-12-30 DIAGNOSIS — L729 Follicular cyst of the skin and subcutaneous tissue, unspecified: Secondary | ICD-10-CM | POA: Insufficient documentation

## 2023-12-30 DIAGNOSIS — R519 Headache, unspecified: Secondary | ICD-10-CM | POA: Insufficient documentation

## 2024-01-08 ENCOUNTER — Other Ambulatory Visit: Payer: Self-pay | Admitting: Nurse Practitioner

## 2024-01-08 DIAGNOSIS — G4726 Circadian rhythm sleep disorder, shift work type: Secondary | ICD-10-CM

## 2024-01-09 ENCOUNTER — Other Ambulatory Visit (HOSPITAL_COMMUNITY): Payer: Self-pay

## 2024-01-09 ENCOUNTER — Other Ambulatory Visit: Payer: Self-pay

## 2024-01-09 MED ORDER — ARMODAFINIL 150 MG PO TABS
150.0000 mg | ORAL_TABLET | Freq: Every day | ORAL | 5 refills | Status: DC
Start: 2024-01-09 — End: 2024-07-31
  Filled 2024-01-09: qty 30, 30d supply, fill #0
  Filled 2024-02-29: qty 30, 30d supply, fill #1
  Filled 2024-05-24: qty 30, 30d supply, fill #2

## 2024-01-11 ENCOUNTER — Ambulatory Visit (AMBULATORY_SURGERY_CENTER): Payer: Commercial Managed Care - PPO

## 2024-01-11 ENCOUNTER — Other Ambulatory Visit (HOSPITAL_COMMUNITY): Payer: Self-pay

## 2024-01-11 VITALS — Ht 68.0 in | Wt 180.0 lb

## 2024-01-11 DIAGNOSIS — Z1211 Encounter for screening for malignant neoplasm of colon: Secondary | ICD-10-CM

## 2024-01-11 MED ORDER — SUFLAVE 178.7 G PO SOLR
1.0000 | Freq: Once | ORAL | 0 refills | Status: AC
  Filled 2024-01-11: qty 2, 1d supply, fill #0

## 2024-01-11 NOTE — Progress Notes (Signed)
 No egg or soy allergy known to patient  No issues known to pt with past sedation with any surgeries or procedures Patient denies ever being told they had issues or difficulty with intubation  No FH of Malignant Hyperthermia Pt is not on diet pills Pt is not on  home 02  Pt is not on blood thinners  Pt has issues with constipation and takes Dulcolax No A fib or A flutter Have any cardiac testing pending--no Pt can ambulate independently Pt denies use of chewing tobacco Discussed diabetic I weight loss medication holds Discussed NSAID holds Checked BMI Pt instructed to use [employer] or GoodRx for a price reduction on prep  Patient's chart reviewed by Cathlyn Parsons CNRA prior to previsit and patient appropriate for the [facility].  Pre visit completed and red dot placed by patient's name on their procedure day (on provider's schedule).

## 2024-02-01 ENCOUNTER — Encounter: Payer: Commercial Managed Care - PPO | Admitting: Gastroenterology

## 2024-02-29 ENCOUNTER — Other Ambulatory Visit: Payer: Self-pay

## 2024-03-02 ENCOUNTER — Encounter: Payer: Self-pay | Admitting: Gastroenterology

## 2024-03-13 ENCOUNTER — Encounter: Admitting: Gastroenterology

## 2024-04-04 ENCOUNTER — Other Ambulatory Visit: Payer: Self-pay

## 2024-04-04 ENCOUNTER — Encounter: Payer: Self-pay | Admitting: Gastroenterology

## 2024-04-04 ENCOUNTER — Ambulatory Visit: Admitting: Gastroenterology

## 2024-04-04 VITALS — BP 115/74 | HR 68 | Temp 98.1°F | Resp 12 | Ht 67.0 in | Wt 180.0 lb

## 2024-04-04 DIAGNOSIS — K641 Second degree hemorrhoids: Secondary | ICD-10-CM | POA: Diagnosis not present

## 2024-04-04 DIAGNOSIS — D128 Benign neoplasm of rectum: Secondary | ICD-10-CM

## 2024-04-04 DIAGNOSIS — D127 Benign neoplasm of rectosigmoid junction: Secondary | ICD-10-CM

## 2024-04-04 DIAGNOSIS — K562 Volvulus: Secondary | ICD-10-CM

## 2024-04-04 DIAGNOSIS — D124 Benign neoplasm of descending colon: Secondary | ICD-10-CM

## 2024-04-04 DIAGNOSIS — K635 Polyp of colon: Secondary | ICD-10-CM

## 2024-04-04 DIAGNOSIS — D123 Benign neoplasm of transverse colon: Secondary | ICD-10-CM

## 2024-04-04 DIAGNOSIS — Z1211 Encounter for screening for malignant neoplasm of colon: Secondary | ICD-10-CM | POA: Diagnosis not present

## 2024-04-04 DIAGNOSIS — F419 Anxiety disorder, unspecified: Secondary | ICD-10-CM | POA: Diagnosis not present

## 2024-04-04 DIAGNOSIS — K644 Residual hemorrhoidal skin tags: Secondary | ICD-10-CM

## 2024-04-04 MED ORDER — SODIUM CHLORIDE 0.9 % IV SOLN
500.0000 mL | Freq: Once | INTRAVENOUS | Status: AC
Start: 2024-04-04 — End: ?

## 2024-04-04 MED ORDER — AMBULATORY NON FORMULARY MEDICATION: 0 refills | Status: AC

## 2024-04-04 NOTE — Progress Notes (Unsigned)
 Report given to PACU, vss

## 2024-04-04 NOTE — Progress Notes (Unsigned)
 Patient slight  nausea and states she "always get sick with anesthesia."  MD updated and Zofran  4 mg IV given, vss

## 2024-04-04 NOTE — Patient Instructions (Signed)
 Handouts Provided:  Polyps, Hemorrhoids and High Fiber Diet  Use FiberCon 1-2 tablets once daily  High Fiber Diet  YOU HAD AN ENDOSCOPIC PROCEDURE TODAY AT THE [facility]:   Refer to the procedure report that was given to you for any specific questions about what was found during the examination.  If the procedure report does not answer your questions, please call your gastroenterologist to clarify.  If you requested that your care partner not be given the details of your procedure findings, then the procedure report has been included in a sealed envelope for you to review at your convenience later.  YOU SHOULD EXPECT: Some feelings of bloating in the abdomen. Passage of more gas than usual.  Walking can help get rid of the air that was put into your GI tract during the procedure and reduce the bloating. If you had a lower endoscopy (such as a colonoscopy or flexible sigmoidoscopy) you may notice spotting of blood in your stool or on the toilet paper. If you underwent a bowel prep for your procedure, you may not have a normal bowel movement for a few days.  Please Note:  You might notice some irritation and congestion in your nose or some drainage.  This is from the oxygen used during your procedure.  There is no need for concern and it should clear up in a day or so.  SYMPTOMS TO REPORT IMMEDIATELY:  Following lower endoscopy (colonoscopy or flexible sigmoidoscopy):  Excessive amounts of blood in the stool  Significant tenderness or worsening of abdominal pains  Swelling of the abdomen that is new, acute  Fever of 100F or higher  For urgent or emergent issues, a gastroenterologist can be reached at any hour by calling [phone]. Do not use MyChart messaging for urgent concerns.    DIET:  We do recommend a small meal at first, but then you may proceed to your regular diet.  Drink plenty of fluids but you should avoid alcoholic beverages for 24 hours.  ACTIVITY:  You  should plan to take it easy for the rest of today and you should NOT DRIVE or use heavy machinery until tomorrow (because of the sedation medicines used during the test).    FOLLOW UP: Our staff will call the number listed on your records the next business day following your procedure.  We will call around 7:15- 8:00 am to check on you and address any questions or concerns that you may have regarding the information given to you following your procedure. If we do not reach you, we will leave a message.     If any biopsies were taken you will be contacted by phone or by letter within the next 1-3 weeks.  Please call us  at [phone] if you have not heard about the biopsies in 3 weeks.    SIGNATURES/CONFIDENTIALITY: You and/or your care partner have signed paperwork which will be entered into your electronic medical record.  These signatures attest to the fact that that the information above on your After Visit Summary has been reviewed and is understood.  Full responsibility of the confidentiality of this discharge information lies with you and/or your care-partner.

## 2024-04-04 NOTE — Op Note (Signed)
 [facility] Patient Name: Angela Mccann Procedure Date: 04/04/2024 3:20 PM MRN: [id number] Endoscopist: Yong Henle , MD, [id number] Age: 53 Referring MD:  Date of Birth: 1971/06/20 Gender: Female Account #: [id number] Procedure:                Colonoscopy Indications:              Colon cancer screening in patient at increased                            risk: Family history of colon polyps in multiple                            1st-degree relatives Medicines:                Monitored Anesthesia Care Procedure:                Pre-Anesthesia Assessment:                           - Prior to the procedure, a History and Physical                            was performed, and patient medications and                            allergies were reviewed. The patient's tolerance of                            previous anesthesia was also reviewed. The risks                            and benefits of the procedure and the sedation                            options and risks were discussed with the patient.                            All questions were answered, and informed consent                            was obtained. Prior Anticoagulants: The patient has                            taken no anticoagulant or antiplatelet agents                            except for NSAID medication. ASA Grade Assessment:                            II - A patient with mild systemic disease. After                            reviewing the risks and benefits, the patient was  deemed in satisfactory condition to undergo the                            procedure.                           After obtaining informed consent, the colonoscope                            was passed under direct vision. Throughout the                            procedure, the patient's blood pressure, pulse, and                            oxygen saturations were monitored continuously. The                             PCF-HQ190L Colonoscope [id number] was introduced                            through the anus and advanced to the the cecum,                            identified by appendiceal orifice and ileocecal                            valve. The patient tolerated the procedure. The                            colonoscopy was somewhat difficult due to                            significant looping. Successful completion of the                            procedure was aided by changing the patient's                            position, using manual pressure, straightening and                            shortening the scope to obtain bowel loop reduction                            and using scope torsion. The quality of the bowel                            preparation was adequate. The ileocecal valve,                            appendiceal orifice, and rectum were photographed. Scope In: 3:37:34 PM Scope Out: 3:56:29 PM Scope Withdrawal Time: 0 hours 13 minutes 7 seconds  Total Procedure Duration: 0 hours 18 minutes 55  seconds  Findings:                 Skin tags were found on perianal exam.                           The digital rectal exam findings include                            hemorrhoids. Pertinent negatives include no                            palpable rectal lesions.                           The colon (entire examined portion) revealed                            significantly excessive looping.                           Five sessile polyps were found in the rectum (1),                            recto-sigmoid colon (1), descending colon (2) and                            transverse colon (1). The polyps were 2 to 5 mm in                            size. These polyps were removed with a cold snare.                            Resection and retrieval were complete.                           Normal mucosa was found in the entire colon                            otherwise.                            Non-bleeding non-thrombosed external and internal                            hemorrhoids were found during retroflexion, during                            perianal exam and during digital exam. The                            hemorrhoids were Grade II (internal hemorrhoids                            that prolapse but reduce spontaneously). Complications:            No immediate complications. Estimated Blood  Loss:     Estimated blood loss was minimal. Impression:               - Perianal skin tags found on perianal exam.                            Hemorrhoids found on digital rectal exam.                           - There was significant looping of the colon.                           - Five 2 to 5 mm polyps in the rectum, at the                            recto-sigmoid colon, in the descending colon and in                            the transverse colon, removed with a cold snare.                            Resected and retrieved.                           - Normal mucosa in the entire examined colon                            otherwise.                           - Non-bleeding non-thrombosed external and internal                            hemorrhoids. Recommendation:           - The patient will be observed post-procedure,                            until all discharge criteria are met.                           - Discharge patient to home.                           - Patient has a contact number available for                            emergencies. The signs and symptoms of potential                            delayed complications were discussed with the                            patient. Return to normal activities tomorrow.  Written discharge instructions were provided to the                            patient.                           - Continue present medications.                           - High fiber diet.                            - Use FiberCon 1-2 tablets PO daily.                           - Await pathology results.                           - Repeat colonoscopy in 3/03/07/09 years for                            surveillance based on pathology results. She does                            have multiple 1st degree relatives with history of                            colon polyps so will need to be mindful and                            considerate of this based on pathology findings.                           - The findings and recommendations were discussed                            with the patient.                           - The findings and recommendations were discussed                            with the patient's family. Yong Henle, MD 04/04/2024 4:10:45 PM
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
## 2024-04-04 NOTE — Progress Notes (Unsigned)
 Called to room to assist during endoscopic procedure.  Patient ID and intended procedure confirmed with present staff. Received instructions for my participation in the procedure from the performing physician.

## 2024-04-04 NOTE — Progress Notes (Signed)
 Pt's states no medical or surgical changes since previsit or office visit.

## 2024-04-05 ENCOUNTER — Telehealth: Payer: Self-pay

## 2024-04-05 NOTE — Telephone Encounter (Signed)
No answer on follow up call. 

## 2024-04-09 LAB — SURGICAL PATHOLOGY

## 2024-04-10 ENCOUNTER — Ambulatory Visit: Payer: Self-pay | Admitting: Gastroenterology

## 2024-05-25 ENCOUNTER — Other Ambulatory Visit: Payer: Self-pay

## 2024-06-11 ENCOUNTER — Encounter: Payer: Self-pay | Admitting: Nurse Practitioner

## 2024-06-27 ENCOUNTER — Encounter: Payer: Commercial Managed Care - PPO | Admitting: Nurse Practitioner

## 2024-07-31 ENCOUNTER — Other Ambulatory Visit (HOSPITAL_COMMUNITY): Payer: Self-pay

## 2024-07-31 ENCOUNTER — Other Ambulatory Visit: Payer: Self-pay | Admitting: Nurse Practitioner

## 2024-07-31 ENCOUNTER — Other Ambulatory Visit: Payer: Self-pay

## 2024-07-31 DIAGNOSIS — G4726 Circadian rhythm sleep disorder, shift work type: Secondary | ICD-10-CM

## 2024-07-31 DIAGNOSIS — G43009 Migraine without aura, not intractable, without status migrainosus: Secondary | ICD-10-CM

## 2024-07-31 MED ORDER — ARMODAFINIL 150 MG PO TABS
150.0000 mg | ORAL_TABLET | Freq: Every day | ORAL | 0 refills | Status: DC
  Filled 2024-07-31: qty 30, 30d supply, fill #0

## 2024-07-31 MED ORDER — BUTALBITAL-APAP-CAFFEINE 50-325-40 MG PO TABS
1.0000 | ORAL_TABLET | Freq: Every day | ORAL | 0 refills | Status: AC | PRN
  Filled 2024-07-31: qty 30, 30d supply, fill #0

## 2024-07-31 NOTE — Telephone Encounter (Signed)
 Over due for CPE. Please schedule within 30 days to continue getting refills. Any slot is ok

## 2024-08-30 ENCOUNTER — Ambulatory Visit: Admitting: Nurse Practitioner

## 2024-09-12 DIAGNOSIS — Z6832 Body mass index (BMI) 32.0-32.9, adult: Secondary | ICD-10-CM | POA: Diagnosis not present

## 2024-09-12 DIAGNOSIS — Z124 Encounter for screening for malignant neoplasm of cervix: Secondary | ICD-10-CM | POA: Diagnosis not present

## 2024-09-12 DIAGNOSIS — N912 Amenorrhea, unspecified: Secondary | ICD-10-CM | POA: Diagnosis not present

## 2024-09-12 DIAGNOSIS — Z01419 Encounter for gynecological examination (general) (routine) without abnormal findings: Secondary | ICD-10-CM | POA: Diagnosis not present

## 2024-09-21 ENCOUNTER — Ambulatory Visit: Admitting: Nurse Practitioner

## 2024-10-23 ENCOUNTER — Ambulatory Visit

## 2024-10-23 ENCOUNTER — Other Ambulatory Visit: Payer: Self-pay

## 2024-10-23 ENCOUNTER — Other Ambulatory Visit (HOSPITAL_COMMUNITY): Payer: Self-pay

## 2024-10-23 VITALS — BP 101/68 | HR 87 | Temp 97.9°F | Ht 67.0 in | Wt 192.0 lb

## 2024-10-23 DIAGNOSIS — Z0001 Encounter for general adult medical examination with abnormal findings: Secondary | ICD-10-CM

## 2024-10-23 DIAGNOSIS — Z13220 Encounter for screening for lipoid disorders: Secondary | ICD-10-CM | POA: Diagnosis not present

## 2024-10-23 DIAGNOSIS — G4726 Circadian rhythm sleep disorder, shift work type: Secondary | ICD-10-CM | POA: Diagnosis not present

## 2024-10-23 DIAGNOSIS — R5383 Other fatigue: Secondary | ICD-10-CM

## 2024-10-23 DIAGNOSIS — Z7689 Persons encountering health services in other specified circumstances: Secondary | ICD-10-CM

## 2024-10-23 DIAGNOSIS — Z0000 Encounter for general adult medical examination without abnormal findings: Secondary | ICD-10-CM

## 2024-10-23 MED ORDER — ARMODAFINIL 150 MG PO TABS
150.0000 mg | ORAL_TABLET | Freq: Every day | ORAL | 2 refills | Status: AC
  Filled 2024-10-23: qty 30, 30d supply, fill #0
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
## 2024-10-24 ENCOUNTER — Ambulatory Visit: Payer: Self-pay

## 2024-10-24 LAB — CBC WITH DIFFERENTIAL/PLATELET
Basophils Absolute: 0 x10E3/uL (ref 0.0–0.2)
Basos: 1 %
EOS (ABSOLUTE): 0.3 x10E3/uL (ref 0.0–0.4)
Eos: 5 %
Hematocrit: 42 % (ref 34.0–46.6)
Hemoglobin: 13.6 g/dL (ref 11.1–15.9)
Immature Grans (Abs): 0 x10E3/uL (ref 0.0–0.1)
Immature Granulocytes: 0 %
Lymphocytes Absolute: 2.4 x10E3/uL (ref 0.7–3.1)
Lymphs: 39 %
MCH: 28.9 pg (ref 26.6–33.0)
MCHC: 32.4 g/dL (ref 31.5–35.7)
MCV: 89 fL (ref 79–97)
Monocytes Absolute: 0.4 x10E3/uL (ref 0.1–0.9)
Monocytes: 7 %
Neutrophils Absolute: 3 x10E3/uL (ref 1.4–7.0)
Neutrophils: 48 %
Platelets: 259 x10E3/uL (ref 150–450)
RBC: 4.7 x10E6/uL (ref 3.77–5.28)
RDW: 12.6 % (ref 11.7–15.4)
WBC: 6.1 x10E3/uL (ref 3.4–10.8)

## 2024-10-24 LAB — CMP14+EGFR
ALT: 25 IU/L (ref 0–32)
AST: 26 IU/L (ref 0–40)
Albumin: 4.8 g/dL (ref 3.8–4.9)
Alkaline Phosphatase: 86 IU/L (ref 49–135)
BUN/Creatinine Ratio: 13 (ref 9–23)
BUN: 12 mg/dL (ref 6–24)
Bilirubin Total: 0.4 mg/dL (ref 0.0–1.2)
CO2: 25 mmol/L (ref 20–29)
Calcium: 9.7 mg/dL (ref 8.7–10.2)
Chloride: 102 mmol/L (ref 96–106)
Creatinine, Ser: 0.95 mg/dL (ref 0.57–1.00)
Globulin, Total: 2.2 g/dL (ref 1.5–4.5)
Glucose: 86 mg/dL (ref 70–99)
Potassium: 4.3 mmol/L (ref 3.5–5.2)
Sodium: 140 mmol/L (ref 134–144)
Total Protein: 7 g/dL (ref 6.0–8.5)
eGFR: 72 mL/min/1.73

## 2024-10-24 LAB — TSH+FREE T4
Free T4: 1.38 ng/dL (ref 0.82–1.77)
TSH: 1.51 u[IU]/mL (ref 0.450–4.500)

## 2024-10-24 LAB — LIPID PANEL
Chol/HDL Ratio: 4.4 ratio (ref 0.0–4.4)
Cholesterol, Total: 190 mg/dL (ref 100–199)
HDL: 43 mg/dL
LDL Chol Calc (NIH): 134 mg/dL — ABNORMAL HIGH (ref 0–99)
Triglycerides: 72 mg/dL (ref 0–149)
VLDL Cholesterol Cal: 13 mg/dL (ref 5–40)
